# Patient Record
Sex: Male | Born: 1960 | Race: White | Hispanic: No | Smoking: Never smoker
Health system: Southern US, Community
[De-identification: ages and names within clinical notes are randomized; demographics above are authoritative.]

## PROBLEM LIST (undated history)

## (undated) DIAGNOSIS — N183 Chronic kidney disease, stage 3 unspecified: Secondary | ICD-10-CM

## (undated) DIAGNOSIS — M1990 Unspecified osteoarthritis, unspecified site: Secondary | ICD-10-CM

## (undated) DIAGNOSIS — E785 Hyperlipidemia, unspecified: Secondary | ICD-10-CM

## (undated) DIAGNOSIS — G4736 Sleep related hypoventilation in conditions classified elsewhere: Secondary | ICD-10-CM

## (undated) DIAGNOSIS — I10 Essential (primary) hypertension: Secondary | ICD-10-CM

## (undated) DIAGNOSIS — G4730 Sleep apnea, unspecified: Secondary | ICD-10-CM

## (undated) DIAGNOSIS — I872 Venous insufficiency (chronic) (peripheral): Secondary | ICD-10-CM

## (undated) DIAGNOSIS — I82409 Acute embolism and thrombosis of unspecified deep veins of unspecified lower extremity: Secondary | ICD-10-CM

## (undated) DIAGNOSIS — M722 Plantar fascial fibromatosis: Secondary | ICD-10-CM

## (undated) DIAGNOSIS — I809 Phlebitis and thrombophlebitis of unspecified site: Secondary | ICD-10-CM

## (undated) HISTORY — DX: Plantar fascial fibromatosis: M72.2

## (undated) HISTORY — PX: SHOULDER SURGERY: SHX246

## (undated) HISTORY — DX: Unspecified osteoarthritis, unspecified site: M19.90

## (undated) HISTORY — PX: KNEE SURGERY: SHX244

## (undated) HISTORY — DX: Acute embolism and thrombosis of unspecified deep veins of unspecified lower extremity: I82.409

## (undated) HISTORY — DX: Sleep related hypoventilation in conditions classified elsewhere: G47.36

## (undated) HISTORY — DX: Venous insufficiency (chronic) (peripheral): I87.2

## (undated) HISTORY — DX: Essential (primary) hypertension: I10

## (undated) HISTORY — PX: APPENDECTOMY: SHX54

## (undated) HISTORY — DX: Phlebitis and thrombophlebitis of unspecified site: I80.9

## (undated) HISTORY — PX: NOSE SURGERY: SHX723

## (undated) HISTORY — DX: Hyperlipidemia, unspecified: E78.5

---

## 1999-12-09 ENCOUNTER — Encounter: Admission: RE | Admit: 1999-12-09 | Discharge: 1999-12-09 | Payer: Self-pay | Admitting: Orthopedic Surgery

## 1999-12-09 ENCOUNTER — Encounter: Payer: Self-pay | Admitting: Orthopedic Surgery

## 2002-05-31 ENCOUNTER — Encounter: Payer: Self-pay | Admitting: *Deleted

## 2002-05-31 ENCOUNTER — Emergency Department (HOSPITAL_COMMUNITY): Admission: EM | Admit: 2002-05-31 | Discharge: 2002-05-31 | Payer: Self-pay | Admitting: *Deleted

## 2004-03-15 ENCOUNTER — Encounter: Admission: RE | Admit: 2004-03-15 | Discharge: 2004-03-15 | Payer: Self-pay | Admitting: Endocrinology

## 2004-03-18 ENCOUNTER — Encounter: Admission: RE | Admit: 2004-03-18 | Discharge: 2004-03-18 | Payer: Self-pay | Admitting: Endocrinology

## 2004-05-18 ENCOUNTER — Encounter: Admission: RE | Admit: 2004-05-18 | Discharge: 2004-06-17 | Payer: Self-pay | Admitting: Neurosurgery

## 2005-06-01 ENCOUNTER — Ambulatory Visit: Payer: Self-pay | Admitting: Endocrinology

## 2005-06-07 ENCOUNTER — Ambulatory Visit: Payer: Self-pay | Admitting: Endocrinology

## 2005-07-07 ENCOUNTER — Ambulatory Visit: Payer: Self-pay | Admitting: Endocrinology

## 2005-07-15 ENCOUNTER — Ambulatory Visit: Payer: Self-pay | Admitting: Physical Medicine & Rehabilitation

## 2005-07-15 ENCOUNTER — Encounter
Admission: RE | Admit: 2005-07-15 | Discharge: 2005-10-13 | Payer: Self-pay | Admitting: Physical Medicine & Rehabilitation

## 2005-07-27 ENCOUNTER — Ambulatory Visit: Payer: Self-pay | Admitting: Endocrinology

## 2006-06-05 ENCOUNTER — Ambulatory Visit: Payer: Self-pay | Admitting: Endocrinology

## 2006-06-26 ENCOUNTER — Ambulatory Visit: Payer: Self-pay | Admitting: Endocrinology

## 2006-06-28 ENCOUNTER — Ambulatory Visit: Payer: Self-pay | Admitting: Endocrinology

## 2007-05-18 ENCOUNTER — Encounter: Payer: Self-pay | Admitting: Endocrinology

## 2007-05-18 DIAGNOSIS — J309 Allergic rhinitis, unspecified: Secondary | ICD-10-CM | POA: Insufficient documentation

## 2007-05-18 DIAGNOSIS — K219 Gastro-esophageal reflux disease without esophagitis: Secondary | ICD-10-CM

## 2007-05-18 DIAGNOSIS — I10 Essential (primary) hypertension: Secondary | ICD-10-CM

## 2007-05-18 DIAGNOSIS — M109 Gout, unspecified: Secondary | ICD-10-CM

## 2007-05-18 DIAGNOSIS — E119 Type 2 diabetes mellitus without complications: Secondary | ICD-10-CM

## 2007-09-11 ENCOUNTER — Ambulatory Visit: Payer: Self-pay | Admitting: Endocrinology

## 2007-09-11 DIAGNOSIS — K7689 Other specified diseases of liver: Secondary | ICD-10-CM | POA: Insufficient documentation

## 2007-09-11 DIAGNOSIS — M545 Low back pain: Secondary | ICD-10-CM

## 2007-09-11 DIAGNOSIS — M79609 Pain in unspecified limb: Secondary | ICD-10-CM | POA: Insufficient documentation

## 2007-09-14 ENCOUNTER — Encounter: Payer: Self-pay | Admitting: Endocrinology

## 2007-09-14 LAB — CONVERTED CEMR LAB
ALT: 30 units/L (ref 0–53)
Albumin: 3.6 g/dL (ref 3.5–5.2)
Alkaline Phosphatase: 63 units/L (ref 39–117)
BUN: 12 mg/dL (ref 6–23)
CO2: 27 meq/L (ref 19–32)
Calcium: 9.2 mg/dL (ref 8.4–10.5)
Crystals: NEGATIVE
Direct LDL: 115.7 mg/dL
GFR calc Af Amer: 116 mL/min
GFR calc non Af Amer: 96 mL/min
Hgb A1c MFr Bld: 7.1 % — ABNORMAL HIGH (ref 4.6–6.0)
Ketones, ur: NEGATIVE mg/dL
Leukocytes, UA: NEGATIVE
Microalb Creat Ratio: 8.1 mg/g (ref 0.0–30.0)
Microalb, Ur: 0.8 mg/dL (ref 0.0–1.9)
Mucus, UA: NEGATIVE
PSA: 0.24 ng/mL (ref 0.10–4.00)
Potassium: 4.2 meq/L (ref 3.5–5.1)
RBC / HPF: NONE SEEN
Specific Gravity, Urine: 1.025 (ref 1.000–1.03)
Total CHOL/HDL Ratio: 6.2
Total Protein: 7.4 g/dL (ref 6.0–8.3)
Uric Acid, Serum: 6.5 mg/dL (ref 2.4–7.0)

## 2008-02-04 ENCOUNTER — Encounter: Admission: RE | Admit: 2008-02-04 | Discharge: 2008-02-04 | Payer: Self-pay | Admitting: Specialist

## 2008-03-18 ENCOUNTER — Telehealth (INDEPENDENT_AMBULATORY_CARE_PROVIDER_SITE_OTHER): Payer: Self-pay | Admitting: *Deleted

## 2008-07-09 ENCOUNTER — Ambulatory Visit: Payer: Self-pay | Admitting: Endocrinology

## 2008-07-09 LAB — CONVERTED CEMR LAB
ALT: 35 units/L (ref 0–53)
AST: 21 units/L (ref 0–37)
Basophils Absolute: 0 10*3/uL (ref 0.0–0.1)
Basophils Relative: 0.5 % (ref 0.0–3.0)
Bilirubin, Direct: 0.1 mg/dL (ref 0.0–0.3)
CO2: 32 meq/L (ref 19–32)
Calcium: 8.9 mg/dL (ref 8.4–10.5)
Chloride: 104 meq/L (ref 96–112)
Glucose, Bld: 132 mg/dL — ABNORMAL HIGH (ref 70–99)
Hemoglobin: 15.7 g/dL (ref 13.0–17.0)
LDL Cholesterol: 98 mg/dL (ref 0–99)
Leukocytes, UA: NEGATIVE
Lymphocytes Relative: 30.8 % (ref 12.0–46.0)
MCHC: 35.4 g/dL (ref 30.0–36.0)
Monocytes Relative: 7.8 % (ref 3.0–12.0)
Neutro Abs: 3.2 10*3/uL (ref 1.4–7.7)
Neutrophils Relative %: 58.1 % (ref 43.0–77.0)
Nitrite: NEGATIVE
RBC: 4.94 M/uL (ref 4.22–5.81)
RDW: 12.5 % (ref 11.5–14.6)
Sodium: 143 meq/L (ref 135–145)
Specific Gravity, Urine: 1.025 (ref 1.000–1.03)
Total Bilirubin: 0.7 mg/dL (ref 0.3–1.2)
Total CHOL/HDL Ratio: 5.6
Total Protein: 7.2 g/dL (ref 6.0–8.3)
Urobilinogen, UA: 0.2 (ref 0.0–1.0)
pH: 5.5 (ref 5.0–8.0)

## 2008-07-11 ENCOUNTER — Ambulatory Visit: Payer: Self-pay | Admitting: Endocrinology

## 2008-07-11 DIAGNOSIS — D696 Thrombocytopenia, unspecified: Secondary | ICD-10-CM

## 2008-07-11 DIAGNOSIS — E291 Testicular hypofunction: Secondary | ICD-10-CM | POA: Insufficient documentation

## 2008-07-11 LAB — CONVERTED CEMR LAB: Estradiol: 27.4 pg/mL

## 2008-07-12 LAB — CONVERTED CEMR LAB
FSH: 8.1 milliintl units/mL
LH: 10.1 milliintl units/mL
Testosterone: 149.92 ng/dL — ABNORMAL LOW (ref 350.00–890)

## 2008-07-30 ENCOUNTER — Ambulatory Visit: Payer: Self-pay | Admitting: Gastroenterology

## 2008-09-01 ENCOUNTER — Ambulatory Visit: Payer: Self-pay | Admitting: Gastroenterology

## 2008-09-01 ENCOUNTER — Telehealth: Payer: Self-pay | Admitting: Gastroenterology

## 2008-09-03 ENCOUNTER — Ambulatory Visit: Payer: Self-pay | Admitting: Gastroenterology

## 2008-09-03 ENCOUNTER — Encounter: Payer: Self-pay | Admitting: Gastroenterology

## 2008-09-03 ENCOUNTER — Ambulatory Visit (HOSPITAL_COMMUNITY): Admission: RE | Admit: 2008-09-03 | Discharge: 2008-09-03 | Payer: Self-pay | Admitting: Gastroenterology

## 2008-09-05 ENCOUNTER — Encounter: Payer: Self-pay | Admitting: Gastroenterology

## 2009-07-08 ENCOUNTER — Encounter: Payer: Self-pay | Admitting: Endocrinology

## 2009-07-13 ENCOUNTER — Telehealth: Payer: Self-pay | Admitting: Endocrinology

## 2009-07-13 ENCOUNTER — Ambulatory Visit: Payer: Self-pay | Admitting: Endocrinology

## 2009-07-14 ENCOUNTER — Telehealth: Payer: Self-pay | Admitting: Endocrinology

## 2009-07-14 ENCOUNTER — Telehealth: Payer: Self-pay | Admitting: Family Medicine

## 2009-07-15 LAB — CONVERTED CEMR LAB
ALT: 44 units/L (ref 0–53)
AST: 27 units/L (ref 0–37)
Albumin: 3.5 g/dL (ref 3.5–5.2)
Alkaline Phosphatase: 50 units/L (ref 39–117)
BUN: 10 mg/dL (ref 6–23)
Basophils Absolute: 0.1 10*3/uL (ref 0.0–0.1)
Basophils Relative: 0.9 % (ref 0.0–3.0)
Bilirubin, Direct: 0.1 mg/dL (ref 0.0–0.3)
CO2: 28 meq/L (ref 19–32)
Calcium: 8.7 mg/dL (ref 8.4–10.5)
Chloride: 102 meq/L (ref 96–112)
Cholesterol: 167 mg/dL (ref 0–200)
Creatinine, Ser: 0.8 mg/dL (ref 0.4–1.5)
Eosinophils Absolute: 0.3 10*3/uL (ref 0.0–0.7)
Eosinophils Relative: 4.9 % (ref 0.0–5.0)
GFR calc non Af Amer: 108.58 mL/min (ref >60)
Glucose, Bld: 295 mg/dL — ABNORMAL HIGH (ref 70–99)
HCT: 45.1 % (ref 39.0–52.0)
HDL: 23.6 mg/dL — ABNORMAL LOW (ref >39.00)
Hemoglobin: 15.6 g/dL (ref 13.0–17.0)
LDL Cholesterol: 109 mg/dL — ABNORMAL HIGH (ref 0–99)
Lymphocytes Relative: 28.1 % (ref 12.0–46.0)
Lymphs Abs: 1.6 10*3/uL (ref 0.7–4.0)
MCHC: 34.6 g/dL (ref 30.0–36.0)
MCV: 91 fL (ref 78.0–100.0)
Monocytes Absolute: 0.4 10*3/uL (ref 0.1–1.0)
Monocytes Relative: 6.9 % (ref 3.0–12.0)
Neutro Abs: 3.4 10*3/uL (ref 1.4–7.7)
Neutrophils Relative %: 59.2 % (ref 43.0–77.0)
Platelets: 127 10*3/uL — ABNORMAL LOW (ref 150.0–400.0)
Potassium: 4.1 meq/L (ref 3.5–5.1)
RBC: 4.95 M/uL (ref 4.22–5.81)
RDW: 12.3 % (ref 11.5–14.6)
Sodium: 139 meq/L (ref 135–145)
TSH: 1.42 microintl units/mL (ref 0.35–5.50)
Total Bilirubin: 0.7 mg/dL (ref 0.3–1.2)
Total CHOL/HDL Ratio: 7
Total Protein: 7.1 g/dL (ref 6.0–8.3)
Triglycerides: 171 mg/dL — ABNORMAL HIGH (ref 0.0–149.0)
Uric Acid, Serum: 7.2 mg/dL (ref 4.0–7.8)
VLDL: 34.2 mg/dL (ref 0.0–40.0)
WBC: 5.8 10*3/uL (ref 4.5–10.5)

## 2009-08-10 ENCOUNTER — Telehealth: Payer: Self-pay | Admitting: Endocrinology

## 2010-10-08 ENCOUNTER — Encounter
Admission: RE | Admit: 2010-10-08 | Discharge: 2010-10-08 | Payer: Self-pay | Source: Home / Self Care | Attending: Orthopaedic Surgery | Admitting: Orthopaedic Surgery

## 2010-11-02 ENCOUNTER — Other Ambulatory Visit: Payer: Self-pay | Admitting: Orthopaedic Surgery

## 2010-11-02 ENCOUNTER — Encounter (HOSPITAL_COMMUNITY): Payer: PRIVATE HEALTH INSURANCE | Attending: Orthopaedic Surgery

## 2010-11-02 ENCOUNTER — Other Ambulatory Visit (HOSPITAL_COMMUNITY): Payer: PRIVATE HEALTH INSURANCE

## 2010-11-02 DIAGNOSIS — Z01812 Encounter for preprocedural laboratory examination: Secondary | ICD-10-CM | POA: Insufficient documentation

## 2010-11-02 LAB — BASIC METABOLIC PANEL
Calcium: 9.5 mg/dL (ref 8.4–10.5)
GFR calc Af Amer: >60 mL/min (ref >60)
GFR calc non Af Amer: >60 mL/min (ref >60)
Sodium: 138 mEq/L (ref 135–145)

## 2010-11-02 LAB — CBC
Hemoglobin: 15.2 g/dL (ref 13.0–17.0)
MCHC: 35.8 g/dL (ref 30.0–36.0)
RDW: 12.6 % (ref 11.5–15.5)
WBC: 6.3 10*3/uL (ref 4.0–10.5)

## 2010-11-02 LAB — SURGICAL PCR SCREEN: MRSA, PCR: NEGATIVE

## 2010-11-05 ENCOUNTER — Observation Stay (HOSPITAL_COMMUNITY)
Admission: RE | Admit: 2010-11-05 | Discharge: 2010-11-06 | Disposition: A | Discharge status: Home / Self Care | Payer: PRIVATE HEALTH INSURANCE | Source: Ambulatory Visit | Attending: Orthopaedic Surgery | Admitting: Orthopaedic Surgery

## 2010-11-05 DIAGNOSIS — M23329 Other meniscus derangements, posterior horn of medial meniscus, unspecified knee: Secondary | ICD-10-CM | POA: Insufficient documentation

## 2010-11-05 DIAGNOSIS — M79609 Pain in unspecified limb: Secondary | ICD-10-CM | POA: Insufficient documentation

## 2010-11-05 DIAGNOSIS — Z794 Long term (current) use of insulin: Secondary | ICD-10-CM | POA: Insufficient documentation

## 2010-11-05 DIAGNOSIS — M2240 Chondromalacia patellae, unspecified knee: Secondary | ICD-10-CM | POA: Insufficient documentation

## 2010-11-05 DIAGNOSIS — K219 Gastro-esophageal reflux disease without esophagitis: Secondary | ICD-10-CM | POA: Insufficient documentation

## 2010-11-05 DIAGNOSIS — E669 Obesity, unspecified: Secondary | ICD-10-CM | POA: Insufficient documentation

## 2010-11-05 DIAGNOSIS — I10 Essential (primary) hypertension: Secondary | ICD-10-CM | POA: Insufficient documentation

## 2010-11-05 DIAGNOSIS — E119 Type 2 diabetes mellitus without complications: Secondary | ICD-10-CM | POA: Insufficient documentation

## 2010-11-05 DIAGNOSIS — M722 Plantar fascial fibromatosis: Principal | ICD-10-CM | POA: Insufficient documentation

## 2010-11-05 LAB — GLUCOSE, CAPILLARY
Glucose-Capillary: 135 mg/dL — ABNORMAL HIGH (ref 70–99)
Glucose-Capillary: 186 mg/dL — ABNORMAL HIGH (ref 70–99)
Glucose-Capillary: 209 mg/dL — ABNORMAL HIGH (ref 70–99)

## 2010-11-10 ENCOUNTER — Other Ambulatory Visit: Payer: Self-pay | Admitting: Orthopaedic Surgery

## 2010-11-10 ENCOUNTER — Ambulatory Visit
Admission: RE | Admit: 2010-11-10 | Discharge: 2010-11-10 | Disposition: A | Discharge status: Home / Self Care | Payer: PRIVATE HEALTH INSURANCE | Source: Ambulatory Visit | Attending: Orthopaedic Surgery | Admitting: Orthopaedic Surgery

## 2010-11-10 ENCOUNTER — Inpatient Hospital Stay (HOSPITAL_COMMUNITY)
Admission: EM | Admit: 2010-11-10 | Discharge: 2010-11-19 | DRG: 300 | Disposition: A | Discharge status: Home / Self Care | Payer: PRIVATE HEALTH INSURANCE | Attending: Orthopaedic Surgery | Admitting: Orthopaedic Surgery

## 2010-11-10 DIAGNOSIS — E119 Type 2 diabetes mellitus without complications: Secondary | ICD-10-CM | POA: Diagnosis present

## 2010-11-10 DIAGNOSIS — R609 Edema, unspecified: Secondary | ICD-10-CM

## 2010-11-10 DIAGNOSIS — R1031 Right lower quadrant pain: Secondary | ICD-10-CM

## 2010-11-10 DIAGNOSIS — Z794 Long term (current) use of insulin: Secondary | ICD-10-CM

## 2010-11-10 DIAGNOSIS — Y838 Other surgical procedures as the cause of abnormal reaction of the patient, or of later complication, without mention of misadventure at the time of the procedure: Secondary | ICD-10-CM | POA: Diagnosis present

## 2010-11-10 DIAGNOSIS — I824Y9 Acute embolism and thrombosis of unspecified deep veins of unspecified proximal lower extremity: Secondary | ICD-10-CM | POA: Diagnosis present

## 2010-11-10 DIAGNOSIS — I999 Unspecified disorder of circulatory system: Principal | ICD-10-CM | POA: Diagnosis present

## 2010-11-10 DIAGNOSIS — I10 Essential (primary) hypertension: Secondary | ICD-10-CM | POA: Diagnosis present

## 2010-11-10 LAB — CBC
MCH: 30.5 pg (ref 26.0–34.0)
MCHC: 33.5 g/dL (ref 30.0–36.0)
Platelets: 123 10*3/uL — ABNORMAL LOW (ref 150–400)

## 2010-11-10 LAB — PROTIME-INR: Prothrombin Time: 13.9 seconds (ref 11.6–15.2)

## 2010-11-11 LAB — CBC
MCH: 30.1 pg (ref 26.0–34.0)
MCV: 91.5 fL (ref 78.0–100.0)
Platelets: 116 10*3/uL — ABNORMAL LOW (ref 150–400)
RBC: 4.35 MIL/uL (ref 4.22–5.81)
RDW: 12.7 % (ref 11.5–15.5)

## 2010-11-11 LAB — GLUCOSE, CAPILLARY: Glucose-Capillary: 276 mg/dL — ABNORMAL HIGH (ref 70–99)

## 2010-11-12 LAB — BASIC METABOLIC PANEL
CO2: 27 mEq/L (ref 19–32)
Chloride: 103 mEq/L (ref 96–112)
GFR calc Af Amer: >60 mL/min (ref >60)
Potassium: 4.3 mEq/L (ref 3.5–5.1)
Sodium: 138 mEq/L (ref 135–145)

## 2010-11-12 LAB — GLUCOSE, CAPILLARY
Glucose-Capillary: 161 mg/dL — ABNORMAL HIGH (ref 70–99)
Glucose-Capillary: 200 mg/dL — ABNORMAL HIGH (ref 70–99)
Glucose-Capillary: 198 mg/dL — ABNORMAL HIGH (ref 70–99)

## 2010-11-12 LAB — PROTIME-INR: INR: 1.03 (ref 0.00–1.49)

## 2010-11-12 NOTE — Op Note (Signed)
NAMEJEDADIAH, ABDALLAH NO.:  [id number]  MEDICAL RECORD NO.:  [id number]           PATIENT TYPE:  I  LOCATION:  1608                         FACILITY:  [facility]  PHYSICIAN:  Vanita Panda. Magnus Ivan, M.D.DATE OF BIRTH:  07-15-1961  DATE OF PROCEDURE:  11/05/2010 DATE OF DISCHARGE:                              OPERATIVE REPORT   PREOPERATIVE DIAGNOSES: 1. Left heel pain and plantar fascitis. 2. 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  POSTOPERATIVE DIAGNOSES: 1. Left heel pain and plantar fascitis. 2. 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  PROCEDURES: 1. Steroid injection with 40 of Depo-Medrol mixed with 4 cc of 1%     lidocaine plain in to the left heel. 2. Right knee arthroscopy with debridement, partial medial  meniscectomy, and chondroplasty of patellofemoral joint.  SURGEON:  Vanita Panda. Magnus Ivan, MD  ASSISTANT:  Flo Shanks, MS4  ESTIMATED BLOOD LOSS:  Minimal.  COMPLICATIONS:  None.  INDICATION:  Mr. Alkhatib is a 50 year old gentleman who weighs in excess of about 360 pounds.  He developed right knee pain with locking and catching that was acute in onset.  An MRI was obtained that showed a complex medial meniscal tear.  He had been having problems getting down on his knees and it did show significant chondromalacia of the patellofemoral joint, but otherwise cartilage well maintained.  He has been having some intractable left heel pain consistent with plantar fascitis as well.  We recommended right knee arthroscopy and he felt like as if he was in the operating room for surgery and being put to sleep, so that I could provide a sterile injection in his left heel.  I counseled him about the risk and benefits of this in general.  He is a diabetic as well.  He did wish to  proceed with surgery.  DESCRIPTION OF PROCEDURE:  After informed consent was obtained, appropriate left heel and right knee were marked.  He was brought to the operating room and placed supine on the operating table.  General anesthesia was then obtained.  At first, we proceeded with a time-out for left heel.  I did prep the heel with Betadine and alcohol and a time- out was called to identify the correct patient and correct left heel. We then provided an injection of 4 cc of 1% plain lidocaine mixed with 1 cc of 40 of Depo-Medrol into the heel and I placed a Band-Aid over this. We then prepped his right knee from the thigh down to the ankle with DuraPrep and sterile drapes including sterile stockinette.  A lateral leg approach was utilized and the bed was raised and the knee was flexed off the side of the table.  A time-out was called again and he was identified as the correct patient and the correct right knee.  I then made an anterior arthroscopy portal off the anterolateral aspect of the knee in the soft part of the knee  and started a cannula.  There was no significant effusion encountered or kink  encountered.  I then placed the camera into the knee, went to medial compartment, where I made an anterior and medial arthroscopy portal.  Right away, we could see that there was a significant medial meniscal tear from the midbody centrally back to the posterior horn. Using arthroscopic shaver and up-cutting biters, we were able to clean the meniscus up and get it trimmed back to at least the red-white zone and left meniscal tissue.  He had probably grade 2 at least chondromalacia of the medial femoral condyle, but nothing extensive.  His intercondylar area of the knee showed an intact ACL and PCL with the knee in a figure-of-4 position.  The lateral compartment was also intact with some mild chondromalacia of the tibial plateau.  Finally, with the knee in extended position, we assessed  the patellofemoral joint.  There was full thickness cartilage loss on the lateral aspect of the patellofemoral joint at the femur and the patella undersurface also showed complete loss of cartilage.  We performed chondroplasty in the patellofemoral joint.  I then removed all instrumentation, allowed fluid to drain from the knee.  I closed the arthroscopy portal sites with interrupted nylon suture.  We inserted a mixture of morphine and Marcaine into the knee, Xeroform followed by well-padded sterile dressings were applied, and the patient was awakened, extubated, and taken to the recovery room in stable condition. Postoperatively, we will admit him for 23-hour observation with slowly increasing his activity as well.     Vanita Panda. Magnus Ivan, M.D.     CYB/MEDQ  D:  11/05/2010  T:  11/05/2010  Job:  [id number]  Electronically Signed by Doneen Poisson M.D. on 11/12/2010 08:15:27 PM

## 2010-11-12 NOTE — H&P (Signed)
  NAMEKAILAN, [name] NO.:  [id number]  MEDICAL RECORD NO.:  [id number]           PATIENT TYPE:  I  LOCATION:  1608                         FACILITY:  [facility]  PHYSICIAN:  Vanita Panda. Magnus Ivan, M.D.DATE OF BIRTH:  06/11/1961  DATE OF ADMISSION:  11/05/2010 DATE OF DISCHARGE:                             HISTORY & PHYSICAL   CHIEF COMPLAINT: 1. Right knee pain. 2. Left heel pain.  HISTORY OF PRESENT ILLNESS:  [name] is a 50 year old gentleman who has had worsening right knee pain and left heel.  The left heel, I feel, has been plantar fasciitis.  The right knee had an MRI that showed a complex tear of the midline posterior horn of the medial meniscus as well as chondromalacia of the patella.  Due to the effects on his activities of daily living with locking and catching and worsening pain, we decided to proceed with a right knee arthroscopy and while he is in the operating room we will provide a steroid injection in his left heel.  PAST MEDICAL HISTORY:  Diabetes.  CURRENT MEDICATIONS:  Percocet, Mobic, glyburide, fish oil, Humalog.  ALLERGIES:  NO KNOWN DRUG ALLERGIES.  FAMILY MEDICAL HISTORY:  Diabetes and cancer.  SOCIAL HISTORY:  He is married.  He is self-employed.  He does not smoke and does not drink.  REVIEW OF SYSTEMS:  Negative for chest pain, shortness of breath, fever, chills, nausea or vomiting.  PHYSICAL EXAMINATION:  VITAL SIGNS:  He is afebrile with stable vital signs. GENERAL:  He is alert and oriented x3 in no acute distress or obvious discomfort. HEENT:  Normocephalic, atraumatic.  Pupils are equal, round and reactive to light.  Extraocular muscles are intact. NECK:  Supple. LUNGS:  Clear to auscultation bilaterally. HEART:  Regular rate and rhythm. ABDOMEN:  Obese, but benign. EXTREMITIES:  Right knee shows an effusion with painful medial joint line and a positive McMurray's sign.  Left heel shows pain on the bottom of the  heel.  ASSESSMENT: 1. Right knee medial meniscal tear and chondromalacia of the patella. 2. Left foot plantar fasciitis.  PLAN:  We will proceed with a right knee arthroscopy today as well as a steroid injection in his left heel.  We will then admit him for observation just due to the surgery and his obesity as well as watching his blood glucose levels after the steroid injection.     Vanita Panda. Magnus Ivan, M.D.     CYB/MEDQ  D:  11/05/2010  T:  11/05/2010  Job:  [id number]  Electronically Signed by Doneen Poisson M.D. on 11/12/2010 08:15:24 PM

## 2010-11-13 LAB — GLUCOSE, CAPILLARY: Glucose-Capillary: 247 mg/dL — ABNORMAL HIGH (ref 70–99)

## 2010-11-14 LAB — CBC
HCT: 40.6 % (ref 39.0–52.0)
Hemoglobin: 13.3 g/dL (ref 13.0–17.0)
MCH: 29.8 pg (ref 26.0–34.0)
RBC: 4.46 MIL/uL (ref 4.22–5.81)

## 2010-11-14 LAB — DIFFERENTIAL
Basophils Relative: 1 % (ref 0–1)
Lymphocytes Relative: 33 % (ref 12–46)
Monocytes Relative: 7 % (ref 3–12)
Neutro Abs: 3.1 10*3/uL (ref 1.7–7.7)
Neutrophils Relative %: 55 % (ref 43–77)

## 2010-11-14 LAB — GLUCOSE, CAPILLARY
Glucose-Capillary: 224 mg/dL — ABNORMAL HIGH (ref 70–99)
Glucose-Capillary: 362 mg/dL — ABNORMAL HIGH (ref 70–99)

## 2010-11-14 LAB — PROTIME-INR
INR: 1.19 (ref 0.00–1.49)
Prothrombin Time: 15.3 seconds — ABNORMAL HIGH (ref 11.6–15.2)

## 2010-11-15 LAB — GLUCOSE, CAPILLARY
Glucose-Capillary: 186 mg/dL — ABNORMAL HIGH (ref 70–99)
Glucose-Capillary: 230 mg/dL — ABNORMAL HIGH (ref 70–99)
Glucose-Capillary: 261 mg/dL — ABNORMAL HIGH (ref 70–99)

## 2010-11-15 LAB — PROTIME-INR: INR: 1.51 — ABNORMAL HIGH (ref 0.00–1.49)

## 2010-11-16 LAB — GLUCOSE, CAPILLARY: Glucose-Capillary: 291 mg/dL — ABNORMAL HIGH (ref 70–99)

## 2010-11-17 LAB — GLUCOSE, CAPILLARY
Glucose-Capillary: 186 mg/dL — ABNORMAL HIGH (ref 70–99)
Glucose-Capillary: 235 mg/dL — ABNORMAL HIGH (ref 70–99)

## 2010-11-17 LAB — PROTIME-INR: Prothrombin Time: 19.2 seconds — ABNORMAL HIGH (ref 11.6–15.2)

## 2010-11-17 LAB — CBC
Platelets: 151 10*3/uL (ref 150–400)
RBC: 4.62 MIL/uL (ref 4.22–5.81)
RDW: 12.5 % (ref 11.5–15.5)
WBC: 6.3 10*3/uL (ref 4.0–10.5)

## 2010-11-18 LAB — GLUCOSE, CAPILLARY
Glucose-Capillary: 207 mg/dL — ABNORMAL HIGH (ref 70–99)
Glucose-Capillary: 118 mg/dL — ABNORMAL HIGH (ref 70–99)
Glucose-Capillary: 192 mg/dL — ABNORMAL HIGH (ref 70–99)

## 2010-11-18 LAB — PROTIME-INR: INR: 1.89 — ABNORMAL HIGH (ref 0.00–1.49)

## 2010-11-19 LAB — PROTIME-INR: INR: 2.49 — ABNORMAL HIGH (ref 0.00–1.49)

## 2010-11-20 NOTE — Discharge Summary (Signed)
NAMERADFORD, PEASE NO.:  [id number]  MEDICAL RECORD NO.:  [id number]           PATIENT TYPE:  I  LOCATION:  1618                         FACILITY:  [facility]  PHYSICIAN:  Vanita Panda. Magnus Ivan, M.D.DATE OF BIRTH:  12/17/1960  DATE OF ADMISSION:  11/10/2010 DATE OF DISCHARGE:  11/19/2010                              DISCHARGE SUMMARY   ADMITTING DIAGNOSIS:  Right lower extremity deep vein thrombosis (DVT) status post knee arthroscopy.  DISCHARGE DIAGNOSIS:  Right lower extremity deep vein thrombosis (DVT) status post knee arthroscopy.  SECONDARY DIAGNOSES: 1. High blood pressure. 2. Diabetes. 3. Obesity.  PROCEDURES:  None.  HOSPITAL COURSE:  [name] is a 50 year old gentleman who 5 days prior to admission underwent a right knee arthroscopy for a torn meniscus.  I saw him in the office about 5 days postoperative, and he had a very tight swollen calf.  I sent him for a noninvasive Doppler study, it was positive for DVT in the popliteal fossa.  This was a femoral vein DVT. He was admitted as an inpatient for anticoagulation.  He was started on Lovenox at therapeutic dose as well as Coumadin.  His prolonged hospitalization was only secondary to getting him therapeutic on his Coumadin.  It was felt we needed to keep him in the hospital due to his obesity and the fact this was DVT that was quite large.  There was definitely anxiety associated with having to have Coumadin as well and overall expensive Coumadin played a factor.  The most importantly though, due to the risk of pulmonary embolus, we wanted to make sure that he was therapeutic on Coumadin before he was discharged.  Dr. [name], his primary care physician did visit him while he was in the hospital as well to offer recommendations about medical care.  HOSPITAL COURSE:  His hospital course was uneventful.  He never did develop any shortness of breath and the swelling in his calf did subside.  By  the day of discharge, he was therapeutic with an INR of 2.49 but this took up to 20 mg of Coumadin to get him to this level.  By the day of discharge, he was afebrile, stable vital signs, and again, complains of no dizziness, lightheadedness, shortness of breath, or chest pain.  It was felt he could be discharged safely to home.  DISPOSITION:  Home.  DISCHARGE INSTRUCTIONS:  While he is at home, he will continue his Coumadin daily at 6:00 p.m.  He will call Dr. [name] office at [facility] to be seen in the Coumadin clinic on Monday, November 22, 2010.  He will take 10 mg of Coumadin at 6 p.m. daily until that time.  He will follow up at [facility] in 1 week after discharge.  DISCHARGE MEDICATIONS: 1. Coumadin 10 mg p.o. daily at 6 p.m. 2. Percocet as needed for pain. 3. Continue all home medications as before as listed on his medication     reconciliation list.     Vanita Panda. Magnus Ivan, M.D.     CYB/MEDQ  D:  11/19/2010  T:  11/19/2010  Job:  [id number]  cc:   [name], M.D. Fax: [phone]  Electronically Signed by Doneen Poisson M.D. on 11/20/2010 05:02:17 PM

## 2010-11-22 ENCOUNTER — Inpatient Hospital Stay (HOSPITAL_COMMUNITY)
Admission: AD | Admit: 2010-11-22 | Discharge: 2010-12-07 | DRG: 300 | Disposition: A | Discharge status: Home / Self Care | Payer: PRIVATE HEALTH INSURANCE | Source: Ambulatory Visit | Attending: Internal Medicine | Admitting: Internal Medicine

## 2010-11-22 DIAGNOSIS — Y849 Medical procedure, unspecified as the cause of abnormal reaction of the patient, or of later complication, without mention of misadventure at the time of the procedure: Secondary | ICD-10-CM | POA: Diagnosis present

## 2010-11-22 DIAGNOSIS — E119 Type 2 diabetes mellitus without complications: Secondary | ICD-10-CM | POA: Diagnosis present

## 2010-11-22 DIAGNOSIS — M1710 Unilateral primary osteoarthritis, unspecified knee: Secondary | ICD-10-CM | POA: Diagnosis present

## 2010-11-22 DIAGNOSIS — I82890 Acute embolism and thrombosis of other specified veins: Secondary | ICD-10-CM | POA: Diagnosis present

## 2010-11-22 DIAGNOSIS — I82409 Acute embolism and thrombosis of unspecified deep veins of unspecified lower extremity: Secondary | ICD-10-CM

## 2010-11-22 DIAGNOSIS — E785 Hyperlipidemia, unspecified: Secondary | ICD-10-CM | POA: Diagnosis present

## 2010-11-22 DIAGNOSIS — I998 Other disorder of circulatory system: Principal | ICD-10-CM | POA: Diagnosis present

## 2010-11-22 LAB — CBC
HCT: 40.9 % (ref 39.0–52.0)
MCH: 30.2 pg (ref 26.0–34.0)
MCV: 90.9 fL (ref 78.0–100.0)
RBC: 4.5 MIL/uL (ref 4.22–5.81)
WBC: 7.3 10*3/uL (ref 4.0–10.5)

## 2010-11-22 LAB — DIFFERENTIAL
Eosinophils Absolute: 0.2 10*3/uL (ref 0.0–0.7)
Lymphocytes Relative: 27 % (ref 12–46)
Lymphs Abs: 2 10*3/uL (ref 0.7–4.0)
Monocytes Relative: 7 % (ref 3–12)
Neutrophils Relative %: 62 % (ref 43–77)

## 2010-11-22 LAB — GLUCOSE, CAPILLARY: Glucose-Capillary: 162 mg/dL — ABNORMAL HIGH (ref 70–99)

## 2010-11-22 LAB — COMPREHENSIVE METABOLIC PANEL
ALT: 30 U/L (ref 0–53)
Alkaline Phosphatase: 54 U/L (ref 39–117)
BUN: 16 mg/dL (ref 6–23)
CO2: 29 mEq/L (ref 19–32)
Chloride: 101 mEq/L (ref 96–112)
GFR calc non Af Amer: >60 mL/min (ref >60)
Glucose, Bld: 278 mg/dL — ABNORMAL HIGH (ref 70–99)
Potassium: 4.4 mEq/L (ref 3.5–5.1)
Sodium: 139 mEq/L (ref 135–145)
Total Bilirubin: 0.6 mg/dL (ref 0.3–1.2)

## 2010-11-22 LAB — PROTIME-INR
INR: 1.2 (ref 0.00–1.49)
Prothrombin Time: 15.4 seconds — ABNORMAL HIGH (ref 11.6–15.2)

## 2010-11-23 ENCOUNTER — Inpatient Hospital Stay (HOSPITAL_COMMUNITY): Payer: PRIVATE HEALTH INSURANCE

## 2010-11-23 LAB — COMPREHENSIVE METABOLIC PANEL
ALT: 31 U/L (ref 0–53)
Albumin: 3.3 g/dL — ABNORMAL LOW (ref 3.5–5.2)
Alkaline Phosphatase: 53 U/L (ref 39–117)
BUN: 16 mg/dL (ref 6–23)
Chloride: 102 mEq/L (ref 96–112)
Glucose, Bld: 160 mg/dL — ABNORMAL HIGH (ref 70–99)
Potassium: 4.2 mEq/L (ref 3.5–5.1)
Sodium: 139 mEq/L (ref 135–145)
Total Bilirubin: 0.4 mg/dL (ref 0.3–1.2)
Total Protein: 7 g/dL (ref 6.0–8.3)

## 2010-11-23 LAB — PROTIME-INR
INR: 1.17 (ref 0.00–1.49)
Prothrombin Time: 15.1 seconds (ref 11.6–15.2)

## 2010-11-23 LAB — GLUCOSE, CAPILLARY: Glucose-Capillary: 105 mg/dL — ABNORMAL HIGH (ref 70–99)

## 2010-11-23 LAB — DIFFERENTIAL
Basophils Absolute: 0 10*3/uL (ref 0.0–0.1)
Eosinophils Relative: 5 % (ref 0–5)
Lymphocytes Relative: 33 % (ref 12–46)
Lymphs Abs: 2 10*3/uL (ref 0.7–4.0)
Neutro Abs: 3.2 10*3/uL (ref 1.7–7.7)
Neutrophils Relative %: 53 % (ref 43–77)

## 2010-11-23 LAB — CBC
HCT: 42.4 % (ref 39.0–52.0)
MCV: 91.8 fL (ref 78.0–100.0)
RBC: 4.62 MIL/uL (ref 4.22–5.81)
RDW: 13 % (ref 11.5–15.5)
WBC: 6.1 10*3/uL (ref 4.0–10.5)

## 2010-11-23 MED ORDER — IOHEXOL 300 MG/ML  SOLN
100.0000 mL | Freq: Once | INTRAMUSCULAR | Status: AC | PRN
  Administered 2010-11-23: 50 mL via INTRAVENOUS

## 2010-11-24 ENCOUNTER — Other Ambulatory Visit (HOSPITAL_COMMUNITY): Payer: PRIVATE HEALTH INSURANCE

## 2010-11-24 ENCOUNTER — Inpatient Hospital Stay (HOSPITAL_COMMUNITY): Payer: PRIVATE HEALTH INSURANCE

## 2010-11-24 LAB — CBC
Platelets: 173 10*3/uL (ref 150–400)
RBC: 4.6 MIL/uL (ref 4.22–5.81)
RDW: 13 % (ref 11.5–15.5)
WBC: 7.6 10*3/uL (ref 4.0–10.5)

## 2010-11-24 LAB — DIFFERENTIAL
Basophils Absolute: 0 10*3/uL (ref 0.0–0.1)
Eosinophils Absolute: 0.3 10*3/uL (ref 0.0–0.7)
Eosinophils Relative: 4 % (ref 0–5)
Neutrophils Relative %: 61 % (ref 43–77)

## 2010-11-24 LAB — BASIC METABOLIC PANEL
Chloride: 103 mEq/L (ref 96–112)
GFR calc Af Amer: >60 mL/min (ref >60)
GFR calc non Af Amer: >60 mL/min (ref >60)
Potassium: 4.5 mEq/L (ref 3.5–5.1)
Sodium: 138 mEq/L (ref 135–145)

## 2010-11-24 LAB — GLUCOSE, CAPILLARY
Glucose-Capillary: 117 mg/dL — ABNORMAL HIGH (ref 70–99)
Glucose-Capillary: 139 mg/dL — ABNORMAL HIGH (ref 70–99)
Glucose-Capillary: 99 mg/dL (ref 70–99)

## 2010-11-24 LAB — FIBRINOGEN: Fibrinogen: 254 mg/dL (ref 204–475)

## 2010-11-24 LAB — HEPARIN LEVEL (UNFRACTIONATED)
Heparin Unfractionated: 0.27 IU/mL — ABNORMAL LOW (ref 0.30–0.70)
Heparin Unfractionated: 0.35 IU/mL (ref 0.30–0.70)

## 2010-11-24 LAB — PROTIME-INR
INR: 1.38 (ref 0.00–1.49)
Prothrombin Time: 17.2 seconds — ABNORMAL HIGH (ref 11.6–15.2)

## 2010-11-24 MED ORDER — IOHEXOL 300 MG/ML  SOLN
100.0000 mL | Freq: Once | INTRAMUSCULAR | Status: AC | PRN
  Administered 2010-11-24: 60 mL via INTRAVENOUS

## 2010-11-25 LAB — DIFFERENTIAL
Basophils Relative: 1 % (ref 0–1)
Eosinophils Absolute: 0.3 10*3/uL (ref 0.0–0.7)
Monocytes Relative: 9 % (ref 3–12)
Neutro Abs: 2.7 10*3/uL (ref 1.7–7.7)
Neutrophils Relative %: 51 % (ref 43–77)

## 2010-11-25 LAB — PROTIME-INR
INR: 1.38 (ref 0.00–1.49)
Prothrombin Time: 17.2 seconds — ABNORMAL HIGH (ref 11.6–15.2)

## 2010-11-25 LAB — BASIC METABOLIC PANEL
Calcium: 8.5 mg/dL (ref 8.4–10.5)
Creatinine, Ser: 0.96 mg/dL (ref 0.4–1.5)
GFR calc Af Amer: >60 mL/min (ref >60)
GFR calc non Af Amer: >60 mL/min (ref >60)
Sodium: 139 mEq/L (ref 135–145)

## 2010-11-25 LAB — CBC
Hemoglobin: 12.4 g/dL — ABNORMAL LOW (ref 13.0–17.0)
MCH: 29.7 pg (ref 26.0–34.0)
Platelets: 139 10*3/uL — ABNORMAL LOW (ref 150–400)
RBC: 4.18 MIL/uL — ABNORMAL LOW (ref 4.22–5.81)
WBC: 5.3 10*3/uL (ref 4.0–10.5)

## 2010-11-25 LAB — GLUCOSE, CAPILLARY
Glucose-Capillary: 102 mg/dL — ABNORMAL HIGH (ref 70–99)
Glucose-Capillary: 154 mg/dL — ABNORMAL HIGH (ref 70–99)

## 2010-11-25 LAB — HEPARIN LEVEL (UNFRACTIONATED): Heparin Unfractionated: 0.22 IU/mL — ABNORMAL LOW (ref 0.30–0.70)

## 2010-11-26 LAB — GLUCOSE, CAPILLARY
Glucose-Capillary: 164 mg/dL — ABNORMAL HIGH (ref 70–99)
Glucose-Capillary: 171 mg/dL — ABNORMAL HIGH (ref 70–99)

## 2010-11-26 LAB — DIFFERENTIAL
Eosinophils Absolute: 0.3 10*3/uL (ref 0.0–0.7)
Lymphocytes Relative: 38 % (ref 12–46)
Lymphs Abs: 2 10*3/uL (ref 0.7–4.0)
Monocytes Relative: 9 % (ref 3–12)
Neutro Abs: 2.5 10*3/uL (ref 1.7–7.7)
Neutrophils Relative %: 47 % (ref 43–77)

## 2010-11-26 LAB — PROTIME-INR
INR: 1.24 (ref 0.00–1.49)
Prothrombin Time: 15.8 seconds — ABNORMAL HIGH (ref 11.6–15.2)

## 2010-11-26 LAB — BASIC METABOLIC PANEL
BUN: 15 mg/dL (ref 6–23)
CO2: 28 mEq/L (ref 19–32)
Chloride: 104 mEq/L (ref 96–112)
Potassium: 4.1 mEq/L (ref 3.5–5.1)

## 2010-11-26 LAB — CBC
Hemoglobin: 13.9 g/dL (ref 13.0–17.0)
MCV: 90.2 fL (ref 78.0–100.0)
Platelets: 159 10*3/uL (ref 150–400)
RBC: 4.61 MIL/uL (ref 4.22–5.81)
WBC: 5.2 10*3/uL (ref 4.0–10.5)

## 2010-11-27 LAB — CBC
Platelets: 190 10*3/uL (ref 150–400)
RBC: 4.69 MIL/uL (ref 4.22–5.81)
WBC: 6.4 10*3/uL (ref 4.0–10.5)

## 2010-11-27 LAB — PROTIME-INR: Prothrombin Time: 15 seconds (ref 11.6–15.2)

## 2010-11-27 LAB — DIFFERENTIAL
Basophils Absolute: 0.1 10*3/uL (ref 0.0–0.1)
Basophils Relative: 1 % (ref 0–1)
Eosinophils Absolute: 0.4 10*3/uL (ref 0.0–0.7)
Neutrophils Relative %: 48 % (ref 43–77)

## 2010-11-27 LAB — BASIC METABOLIC PANEL
BUN: 16 mg/dL (ref 6–23)
Calcium: 9.4 mg/dL (ref 8.4–10.5)
GFR calc non Af Amer: >60 mL/min (ref >60)
Glucose, Bld: 171 mg/dL — ABNORMAL HIGH (ref 70–99)
Sodium: 140 mEq/L (ref 135–145)

## 2010-11-27 LAB — GLUCOSE, CAPILLARY: Glucose-Capillary: 216 mg/dL — ABNORMAL HIGH (ref 70–99)

## 2010-11-28 LAB — PROTIME-INR: Prothrombin Time: 14.2 seconds (ref 11.6–15.2)

## 2010-11-28 LAB — CBC
Hemoglobin: 13.4 g/dL (ref 13.0–17.0)
RBC: 4.45 MIL/uL (ref 4.22–5.81)
WBC: 5.9 10*3/uL (ref 4.0–10.5)

## 2010-11-28 LAB — DIFFERENTIAL
Basophils Absolute: 0.1 10*3/uL (ref 0.0–0.1)
Basophils Relative: 1 % (ref 0–1)
Neutro Abs: 2.9 10*3/uL (ref 1.7–7.7)
Neutrophils Relative %: 49 % (ref 43–77)

## 2010-11-28 LAB — GLUCOSE, CAPILLARY
Glucose-Capillary: 181 mg/dL — ABNORMAL HIGH (ref 70–99)
Glucose-Capillary: 163 mg/dL — ABNORMAL HIGH (ref 70–99)
Glucose-Capillary: 166 mg/dL — ABNORMAL HIGH (ref 70–99)

## 2010-11-28 LAB — BASIC METABOLIC PANEL
CO2: 29 mEq/L (ref 19–32)
Calcium: 9.3 mg/dL (ref 8.4–10.5)
Chloride: 103 mEq/L (ref 96–112)
GFR calc Af Amer: >60 mL/min (ref >60)
Potassium: 4.5 mEq/L (ref 3.5–5.1)
Sodium: 139 mEq/L (ref 135–145)

## 2010-11-29 LAB — CBC
MCH: 30 pg (ref 26.0–34.0)
MCV: 91.2 fL (ref 78.0–100.0)
Platelets: 171 10*3/uL (ref 150–400)
RBC: 4.57 MIL/uL (ref 4.22–5.81)

## 2010-11-29 LAB — GLUCOSE, CAPILLARY
Glucose-Capillary: 238 mg/dL — ABNORMAL HIGH (ref 70–99)
Glucose-Capillary: 236 mg/dL — ABNORMAL HIGH (ref 70–99)
Glucose-Capillary: 243 mg/dL — ABNORMAL HIGH (ref 70–99)

## 2010-11-29 LAB — DIFFERENTIAL
Eosinophils Absolute: 0.4 10*3/uL (ref 0.0–0.7)
Lymphs Abs: 2.4 10*3/uL (ref 0.7–4.0)
Monocytes Relative: 9 % (ref 3–12)
Neutrophils Relative %: 51 % (ref 43–77)

## 2010-11-29 LAB — BASIC METABOLIC PANEL
CO2: 30 mEq/L (ref 19–32)
Chloride: 100 mEq/L (ref 96–112)
GFR calc Af Amer: >60 mL/min (ref >60)
Potassium: 4.5 mEq/L (ref 3.5–5.1)
Sodium: 137 mEq/L (ref 135–145)

## 2010-11-30 LAB — DIFFERENTIAL
Basophils Absolute: 0.1 10*3/uL (ref 0.0–0.1)
Basophils Relative: 1 % (ref 0–1)
Lymphocytes Relative: 36 % (ref 12–46)
Monocytes Absolute: 0.6 10*3/uL (ref 0.1–1.0)
Neutro Abs: 2.9 10*3/uL (ref 1.7–7.7)
Neutrophils Relative %: 48 % (ref 43–77)

## 2010-11-30 LAB — GLUCOSE, CAPILLARY
Glucose-Capillary: 183 mg/dL — ABNORMAL HIGH (ref 70–99)
Glucose-Capillary: 122 mg/dL — ABNORMAL HIGH (ref 70–99)

## 2010-11-30 LAB — PROTIME-INR
INR: 1.43 (ref 0.00–1.49)
Prothrombin Time: 17.6 seconds — ABNORMAL HIGH (ref 11.6–15.2)

## 2010-11-30 LAB — BASIC METABOLIC PANEL
CO2: 29 mEq/L (ref 19–32)
Calcium: 9.1 mg/dL (ref 8.4–10.5)
Chloride: 102 mEq/L (ref 96–112)
GFR calc Af Amer: >60 mL/min (ref >60)
Glucose, Bld: 131 mg/dL — ABNORMAL HIGH (ref 70–99)
Potassium: 4.5 mEq/L (ref 3.5–5.1)
Sodium: 138 mEq/L (ref 135–145)

## 2010-11-30 LAB — CBC
HCT: 44.7 % (ref 39.0–52.0)
Hemoglobin: 15 g/dL (ref 13.0–17.0)
MCHC: 33.6 g/dL (ref 30.0–36.0)
RBC: 4.93 MIL/uL (ref 4.22–5.81)
WBC: 6 10*3/uL (ref 4.0–10.5)

## 2010-12-01 LAB — GLUCOSE, CAPILLARY
Glucose-Capillary: 80 mg/dL (ref 70–99)
Glucose-Capillary: 136 mg/dL — ABNORMAL HIGH (ref 70–99)
Glucose-Capillary: 222 mg/dL — ABNORMAL HIGH (ref 70–99)

## 2010-12-01 LAB — DIFFERENTIAL
Basophils Absolute: 0.1 10*3/uL (ref 0.0–0.1)
Basophils Relative: 1 % (ref 0–1)
Eosinophils Absolute: 0.4 10*3/uL (ref 0.0–0.7)
Monocytes Relative: 11 % (ref 3–12)
Neutro Abs: 3.6 10*3/uL (ref 1.7–7.7)
Neutrophils Relative %: 52 % (ref 43–77)

## 2010-12-01 LAB — CBC
MCH: 30.2 pg (ref 26.0–34.0)
MCHC: 33 g/dL (ref 30.0–36.0)
Platelets: 159 10*3/uL (ref 150–400)
RBC: 4.57 MIL/uL (ref 4.22–5.81)

## 2010-12-01 LAB — BASIC METABOLIC PANEL
Calcium: 9.2 mg/dL (ref 8.4–10.5)
Creatinine, Ser: 0.92 mg/dL (ref 0.4–1.5)
GFR calc Af Amer: >60 mL/min (ref >60)
Sodium: 140 mEq/L (ref 135–145)

## 2010-12-02 LAB — GLUCOSE, CAPILLARY: Glucose-Capillary: 140 mg/dL — ABNORMAL HIGH (ref 70–99)

## 2010-12-02 LAB — BASIC METABOLIC PANEL
BUN: 13 mg/dL (ref 6–23)
GFR calc non Af Amer: >60 mL/min (ref >60)
Glucose, Bld: 115 mg/dL — ABNORMAL HIGH (ref 70–99)
Potassium: 4 mEq/L (ref 3.5–5.1)

## 2010-12-02 LAB — DIFFERENTIAL
Basophils Absolute: 0.1 10*3/uL (ref 0.0–0.1)
Basophils Relative: 1 % (ref 0–1)
Eosinophils Absolute: 0.3 10*3/uL (ref 0.0–0.7)
Eosinophils Relative: 6 % — ABNORMAL HIGH (ref 0–5)
Monocytes Absolute: 0.5 10*3/uL (ref 0.1–1.0)

## 2010-12-02 LAB — CBC
HCT: 40.9 % (ref 39.0–52.0)
MCHC: 32.5 g/dL (ref 30.0–36.0)
Platelets: 143 10*3/uL — ABNORMAL LOW (ref 150–400)
RDW: 13.2 % (ref 11.5–15.5)

## 2010-12-02 LAB — PROTIME-INR: Prothrombin Time: 18.8 seconds — ABNORMAL HIGH (ref 11.6–15.2)

## 2010-12-02 LAB — HEPARIN LEVEL (UNFRACTIONATED): Heparin Unfractionated: 0.3 IU/mL (ref 0.30–0.70)

## 2010-12-03 LAB — GLUCOSE, CAPILLARY
Glucose-Capillary: 123 mg/dL — ABNORMAL HIGH (ref 70–99)
Glucose-Capillary: 232 mg/dL — ABNORMAL HIGH (ref 70–99)

## 2010-12-03 LAB — BASIC METABOLIC PANEL
Calcium: 9.2 mg/dL (ref 8.4–10.5)
Creatinine, Ser: 0.89 mg/dL (ref 0.4–1.5)
GFR calc Af Amer: >60 mL/min (ref >60)
GFR calc non Af Amer: >60 mL/min (ref >60)

## 2010-12-03 LAB — PROTIME-INR: Prothrombin Time: 26.7 seconds — ABNORMAL HIGH (ref 11.6–15.2)

## 2010-12-03 LAB — CBC
MCH: 29.8 pg (ref 26.0–34.0)
MCHC: 32.2 g/dL (ref 30.0–36.0)
Platelets: 136 10*3/uL — ABNORMAL LOW (ref 150–400)
RBC: 4.49 MIL/uL (ref 4.22–5.81)
RDW: 13.2 % (ref 11.5–15.5)

## 2010-12-03 LAB — HEPARIN LEVEL (UNFRACTIONATED): Heparin Unfractionated: 0.52 IU/mL (ref 0.30–0.70)

## 2010-12-03 LAB — DIFFERENTIAL
Basophils Relative: 1 % (ref 0–1)
Eosinophils Absolute: 0.3 10*3/uL (ref 0.0–0.7)
Eosinophils Relative: 5 % (ref 0–5)
Monocytes Absolute: 0.7 10*3/uL (ref 0.1–1.0)
Monocytes Relative: 12 % (ref 3–12)
Neutrophils Relative %: 45 % (ref 43–77)

## 2010-12-04 LAB — CBC
MCHC: 32.3 g/dL (ref 30.0–36.0)
MCV: 92.5 fL (ref 78.0–100.0)
Platelets: 127 10*3/uL — ABNORMAL LOW (ref 150–400)
RDW: 13.2 % (ref 11.5–15.5)
WBC: 6.3 10*3/uL (ref 4.0–10.5)

## 2010-12-04 LAB — GLUCOSE, CAPILLARY
Glucose-Capillary: 86 mg/dL (ref 70–99)
Glucose-Capillary: 128 mg/dL — ABNORMAL HIGH (ref 70–99)
Glucose-Capillary: 171 mg/dL — ABNORMAL HIGH (ref 70–99)

## 2010-12-04 LAB — PROTIME-INR: INR: 3.06 — ABNORMAL HIGH (ref 0.00–1.49)

## 2010-12-04 LAB — DIFFERENTIAL
Basophils Absolute: 0.1 10*3/uL (ref 0.0–0.1)
Eosinophils Absolute: 0.4 10*3/uL (ref 0.0–0.7)
Eosinophils Relative: 6 % — ABNORMAL HIGH (ref 0–5)
Monocytes Absolute: 0.6 10*3/uL (ref 0.1–1.0)

## 2010-12-04 LAB — BASIC METABOLIC PANEL
BUN: 14 mg/dL (ref 6–23)
Chloride: 105 mEq/L (ref 96–112)
Creatinine, Ser: 1 mg/dL (ref 0.4–1.5)
Glucose, Bld: 100 mg/dL — ABNORMAL HIGH (ref 70–99)
Potassium: 4.5 mEq/L (ref 3.5–5.1)

## 2010-12-05 LAB — BASIC METABOLIC PANEL
CO2: 31 mEq/L (ref 19–32)
Calcium: 9.2 mg/dL (ref 8.4–10.5)
Creatinine, Ser: 1.07 mg/dL (ref 0.4–1.5)
GFR calc Af Amer: >60 mL/min (ref >60)
GFR calc non Af Amer: >60 mL/min (ref >60)
Sodium: 140 mEq/L (ref 135–145)

## 2010-12-05 LAB — HEPARIN LEVEL (UNFRACTIONATED): Heparin Unfractionated: 0.49 IU/mL (ref 0.30–0.70)

## 2010-12-05 LAB — DIFFERENTIAL
Basophils Absolute: 0 10*3/uL (ref 0.0–0.1)
Basophils Relative: 1 % (ref 0–1)
Eosinophils Absolute: 0.3 10*3/uL (ref 0.0–0.7)
Monocytes Relative: 9 % (ref 3–12)
Neutro Abs: 2.6 10*3/uL (ref 1.7–7.7)
Neutrophils Relative %: 48 % (ref 43–77)

## 2010-12-05 LAB — PROTIME-INR
INR: 2.99 — ABNORMAL HIGH (ref 0.00–1.49)
Prothrombin Time: 31.1 seconds — ABNORMAL HIGH (ref 11.6–15.2)

## 2010-12-05 LAB — CBC
Hemoglobin: 12.8 g/dL — ABNORMAL LOW (ref 13.0–17.0)
MCH: 29.8 pg (ref 26.0–34.0)
Platelets: 125 10*3/uL — ABNORMAL LOW (ref 150–400)
RBC: 4.3 MIL/uL (ref 4.22–5.81)
WBC: 5.4 10*3/uL (ref 4.0–10.5)

## 2010-12-05 LAB — GLUCOSE, CAPILLARY
Glucose-Capillary: 127 mg/dL — ABNORMAL HIGH (ref 70–99)
Glucose-Capillary: 218 mg/dL — ABNORMAL HIGH (ref 70–99)

## 2010-12-06 LAB — GLUCOSE, CAPILLARY
Glucose-Capillary: 102 mg/dL — ABNORMAL HIGH (ref 70–99)
Glucose-Capillary: 106 mg/dL — ABNORMAL HIGH (ref 70–99)
Glucose-Capillary: 156 mg/dL — ABNORMAL HIGH (ref 70–99)

## 2010-12-06 LAB — CBC
MCHC: 33.3 g/dL (ref 30.0–36.0)
Platelets: 116 10*3/uL — ABNORMAL LOW (ref 150–400)
RDW: 12.9 % (ref 11.5–15.5)
WBC: 5.2 10*3/uL (ref 4.0–10.5)

## 2010-12-06 LAB — BASIC METABOLIC PANEL
BUN: 19 mg/dL (ref 6–23)
Creatinine, Ser: 0.98 mg/dL (ref 0.4–1.5)
GFR calc non Af Amer: >60 mL/min (ref >60)
Glucose, Bld: 108 mg/dL — ABNORMAL HIGH (ref 70–99)

## 2010-12-06 LAB — PROTIME-INR
INR: 2.74 — ABNORMAL HIGH (ref 0.00–1.49)
Prothrombin Time: 29.1 seconds — ABNORMAL HIGH (ref 11.6–15.2)

## 2010-12-06 LAB — DIFFERENTIAL
Basophils Absolute: 0 10*3/uL (ref 0.0–0.1)
Basophils Relative: 1 % (ref 0–1)
Eosinophils Absolute: 0.3 10*3/uL (ref 0.0–0.7)
Eosinophils Relative: 6 % — ABNORMAL HIGH (ref 0–5)

## 2010-12-07 LAB — DIFFERENTIAL
Basophils Absolute: 0 10*3/uL (ref 0.0–0.1)
Basophils Relative: 1 % (ref 0–1)
Eosinophils Absolute: 0.3 10*3/uL (ref 0.0–0.7)
Monocytes Absolute: 0.6 10*3/uL (ref 0.1–1.0)
Monocytes Relative: 11 % (ref 3–12)
Neutro Abs: 2.6 10*3/uL (ref 1.7–7.7)

## 2010-12-07 LAB — GLUCOSE, CAPILLARY

## 2010-12-07 LAB — BASIC METABOLIC PANEL
BUN: 17 mg/dL (ref 6–23)
CO2: 30 mEq/L (ref 19–32)
Calcium: 8.9 mg/dL (ref 8.4–10.5)
Creatinine, Ser: 1 mg/dL (ref 0.4–1.5)
GFR calc non Af Amer: >60 mL/min (ref >60)
Glucose, Bld: 207 mg/dL — ABNORMAL HIGH (ref 70–99)
Sodium: 141 mEq/L (ref 135–145)

## 2010-12-07 LAB — PROTIME-INR: Prothrombin Time: 31.5 seconds — ABNORMAL HIGH (ref 11.6–15.2)

## 2010-12-07 LAB — CBC
HCT: 41.4 % (ref 39.0–52.0)
Hemoglobin: 13.5 g/dL (ref 13.0–17.0)
MCH: 30.1 pg (ref 26.0–34.0)
MCHC: 32.6 g/dL (ref 30.0–36.0)
MCV: 92.4 fL (ref 78.0–100.0)
Platelets: 127 10*3/uL — ABNORMAL LOW (ref 150–400)
RBC: 4.48 MIL/uL (ref 4.22–5.81)
RDW: 13.1 % (ref 11.5–15.5)
WBC: 5.7 10*3/uL (ref 4.0–10.5)

## 2010-12-07 LAB — HEPARIN LEVEL (UNFRACTIONATED): Heparin Unfractionated: <0.1 IU/mL — ABNORMAL LOW (ref 0.30–0.70)

## 2010-12-07 NOTE — Discharge Summary (Signed)
NAMEEARLY, Martin NO.:  [id number]  MEDICAL RECORD NO.:  [id number]           PATIENT TYPE:  I  LOCATION:  1615                         FACILITY:  [facility]  PHYSICIAN:  Geoffry Paradise, M.D.  DATE OF BIRTH:  1960-11-14  DATE OF ADMISSION:  11/22/2010 DATE OF DISCHARGE:                              DISCHARGE SUMMARY   DATE OF ANTICIPATED DISCHARGE:  March 19 or December 07, 2010.  DIAGNOSES AT THE TIME OF DISCHARGE: 1. Right lower extremity deep venous thrombosis with extensive clot     burden. 2. Diabetes mellitus, type 2, insulin dependent, controlled. 3. Morbid obesity. 4. Hyperlipidemia. 5. Osteoarthritis, status post right knee arthroscopy.  HISTORY OF PRESENT ILLNESS:  [name] is a 50 year old gentleman who evidently developed a right lower extremity deep venous thrombosis post knee arthroscopy for an acute meniscal tear.  He had a difficult hospital stay from February 22 to March 2 where his INR was difficult to move and ultimately he was discharged on 10 mg daily after requiring 15- 20 mg to move his INR outward.  Upon representing to the office and subsequent readmission to the hospital, he was subtherapeutic with an INR of 1.8 on 10 mg daily with extensive right lower extremity pain. Given this, he was readmitted for IV heparin, more aggressive Coumadin and consultative approach from interventional radiology for possible thrombolysis.  For details, see dictated summary by my partner Dr. Rodrigo Ran.  DATA:  The right lower extremity venous duplex persistent acute occlusive right femoral popliteal DVT that was on March 6.  On March 8 following thrombolytic therapy for several days, successful recanalization of the right femoral vein thrombolytic therapy discontinued.  Chemistries on March 19, sodium 141, potassium 4.5, chloride 104, CO2 30, glucose 108, BUN 19, creatinine 0.98, calcium 9.2. March 19, INR was 2.74.  CBC hemoglobin is 13.3,  hematocrit 40, white blood count 5.2, platelet count 116,000.  INR on March 18 was 2.99, INR on March 17 was 3.06, INR on March 16 was 2.45.  HOSPITAL COURSE:  The patient was admitted, placed on IV heparin. Interventional radiology consulted and he was placed on a tenecteplase (TNKase) infusion beginning March 7 at 5 mg per 500 mL, right lower extremity at a rate of 25 mL per hour of 0.25 mg per hour.  He was monitored in the intensive care unit and actually had improvement to the extent that the right lower extremity lessened in pain, lessened in edema.  Thrombolytic therapy was discontinued on March 7 following successful recanalization and heparin and Coumadin resumed.  Over the ensuing week, aggressive dosing of Coumadin with overlap of heparin was accomplished such that INR was well in excess of 2.0 for 4 consecutive days prior to discontinuation of heparin.  The patient was placed in a thigh-high TED hose.  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  We have had the patient ambulating, aggressively but he remains somewhat emotional over this entire episode  and fears of his INR dropping.  On the morning of this dictation, I had and again an extensive discussion with he, his wife and nursing present about the fact that this now is in the realm of outpatient intervention but he remains frightened of discharge and frustrated over the right lower extremity persistent symptoms.  He clearly has evidence of postphlebitic leg complicated by his morbid obesity and I have explained that despite the fluctuations in INR, we can easily manage this as an outpatient and this is more the risk of thromboembolic disease as apposed to right lower extremity symptomatology as this will persist.  We discussed  the fact this be a multi-month process to include weight loss, exercise and good dietary habits.  He and his wife were discussing this at length this morning and we are poised for discharge today or tomorrow with a therapeutic INR for 4 consecutive days.  We will consult social services as well to assist with any financial issues as possible.  He has remained stable from a cardiopulmonary standpoint and blood sugars have been stable as well, currently at his baseline.  He is instructed on a diet, safe for Coumadin and consistent with his diabetic needs as well.  He understands the need for weight loss and dietary compliance.  He has been fit with thigh-high TED hose and understands to wear them daytime only.  DISCHARGE MEDICATIONS: 1. Coumadin, currently dosed at 20 mg daily, has 10 mg tablets.  This     will be adjusted as an outpatient with frequent INRs in my office. 2. He is on Humalog insulin 30 units a.c. 3. He is on glyburide 10 mg b.i.d. 4. Prilosec OTC 20 mg daily. 5. Percocet 5/325 every 4 hours as needed for pain. 6. MiraLax 17 g daily for bowels. 7. Tylenol 650 q. 4 p.r.n. pain. 8. Relafen has been discontinued.He is on a no concentrated sweet Coumadin safe diet.  He will follow up with INRs in my office within 1-2 days following discharge and as I have discussed with him, should have difficulty with dips, we will supply him with some samples of Lovenox to bridge any gaps.  I reassured him that he will be safe along these lines as long as he is compliant.  I have also discussed the fact that the right lower extremity pain and symptomatology can not be fixed with continued hospitalization or any other intervention, this is a long-term intervention.          ______________________________ Geoffry Paradise, M.D.     RA/MEDQ  D:  12/06/2010  T:  12/06/2010  Job:  [id number]  Electronically Signed by Geoffry Paradise M.D. on 12/07/2010 12:43:19 PM

## 2010-12-08 NOTE — H&P (Signed)
NAME:  BEREL, NAJJAR NO.:  [id number]  MEDICAL RECORD NO.:  [id number]           PATIENT TYPE:  I  LOCATION:  1316                         FACILITY:  [facility]  PHYSICIAN:  Eleanora Guinyard A. Kaeleigh Westendorf, M.D.   DATE OF BIRTH:  [birth year]  DATE OF ADMISSION:  11/22/2010 DATE OF DISCHARGE:                             HISTORY & PHYSICAL   CHIEF COMPLAINT:  Right leg swelling and pain.  HISTORY OF PRESENT ILLNESS:  Kriston is a pleasant 50 year old gentleman who recently developed a right lower extremity deep vein thrombosis status post knee arthroscopy for an acute meniscal tear.  It was difficult during his last hospital stay from November 10, 2010, to November 19, 2010, to get his INR to move up.  He required 20 mg and 15 mg daily of Coumadin to get his INR to move.  Upon discharge, he was reduced to 10 mg daily of Coumadin thinking that there may have been a chance that we would overshoot his treatment.  He was discharged from the hospital 3 days ago.  He had a check of his INR today which was 1.8 which was somewhat subtherapeutic.  Furthermore, he reported increased pain and swelling of the right lower extremity.  He was very concerned about this and given the fact that he was subtherapeutic, we decided to readmit him to the hospital to reinstitute heparin drip and Coumadin therapy at a higher dose and to also ask Interventional Radiology for an opinion to be sure there are no other therapies for his DVT that are needed at this time.  PAST MEDICAL HISTORY: 1. Type 2 diabetes for the last 5 years. 2. Morbid obesity. 3. Hyperlipidemia. 4. Gastroesophageal reflux. 5. Past history of appendectomy, 1978. 6. Broken nose, 1985. 7. Previous left shoulder surgery.  ALLERGIES:  No known drug allergies.  MEDICATIONS:  Currently include: 1. Coumadin 10 mg daily. 2. Humalog 22 to 30 units subcu 3 times a day before meals. 3. Prilosec 20 mg daily. 4. Relafen 750 mg twice a day. 5.  Glyburide 5 mg 2 pills twice daily. 6. Percocet 5/325 two pills every 4 hours as needed for pain.  He has not been taking niacin or fish oil or meloxicam lately.  SOCIAL HISTORY:  He is married.  He has 2 sons.  He has a high school education.  He is employed by [employer], he is self-employed. No tobacco, no alcohol, no drug use.  FAMILY HISTORY:  Father died at age 33 from colon cancer.  There was a history of diabetes, arthritis, and kidney stones in the family.  Mother died at age 75 of ovarian cancer, there was also a history of hypertension, diabetes, arthritis, ulcers, and allergy on her side as well.  REVIEW OF SYSTEMS:  The patient denies chest pain or shortness of breath or fevers.  He had been more mobile the last few days since he was at home and he noticed increased swelling and he also noticed more of a knot at the right inner thigh area.  He had normal bowel movements in the last day or so and he  had backed off the Percocet to try to help his bowel function.  PHYSICAL EXAMINATION:  VITAL SIGNS:  Temperature is 97.8, blood pressure 168/92, pulse 98, respiratory rate 18, 94% saturation on room air. GENERAL:  He is semi-supine in bed in no acute distress.  He is alert and oriented x4. LUNGS:  Clear to auscultation bilaterally with no wheezes, rales, or rhonchi. HEART:  Regular rate and rhythm with no significant murmur, rub, or gallop. ABDOMEN:  Obese, but soft, nontender, nondistended. EXTREMITIES:  On the left lower extremity, he has 1+ nonpitting edema. He does have some chronic venous stasis skin changes with some hyperpigmentation of the left calf.  The right lower extremity has 2+ edema involving the foot, ankle, and calf and extending to the thigh. There is no significant redness.  The right lower extremity is slightly warm.  LABORATORY DATA:  INR earlier was 1.8, but a current INR is now 1.2. White count was 7.3, hemoglobin 13.6, platelet count  185,000.  There are 62% segs, 27% lymphocytes.  Sodium is 139, potassium 4.4, chloride 101, CO2 of 29, BUN 16, creatinine 0.98, glucose 278, GFR greater than 60, alk phos 54, AST 20, ALT 30, total protein 6.8, albumin 3.2, calcium 9.0.  ASSESSMENT AND PLAN:  A 50 year old gentleman with a right lower extremity deep vein thrombosis status post recent orthopedic procedure, who is very resistant to Coumadin.  We will admit him and place him on a heparin drip per pharmacy.  We will put him on Coumadin 20 mg this evening and then 15 mg each evening thereafter.  We will check his INR daily.  We have also asked Interventional Radiology to see the patient as well.  We will place him on bed rest currently and hopefully mobilize him more once his INR and heparin have been ongoing for 24 hours at least.  He is a full code status.     Lee-Anne Flicker A. Waynard Edwards, M.D.     MAP/MEDQ  D:  11/22/2010  T:  11/22/2010  Job:  [id number]  Electronically Signed by Rodrigo Ran M.D. on 12/08/2010 11:57:23 AM

## 2011-01-10 ENCOUNTER — Encounter (INDEPENDENT_AMBULATORY_CARE_PROVIDER_SITE_OTHER): Payer: PRIVATE HEALTH INSURANCE

## 2011-01-10 ENCOUNTER — Encounter (INDEPENDENT_AMBULATORY_CARE_PROVIDER_SITE_OTHER): Payer: PRIVATE HEALTH INSURANCE | Admitting: Surgery

## 2011-01-10 DIAGNOSIS — I80299 Phlebitis and thrombophlebitis of other deep vessels of unspecified lower extremity: Secondary | ICD-10-CM

## 2011-01-10 DIAGNOSIS — I824Z9 Acute embolism and thrombosis of unspecified deep veins of unspecified distal lower extremity: Secondary | ICD-10-CM

## 2011-01-10 DIAGNOSIS — M79609 Pain in unspecified limb: Secondary | ICD-10-CM

## 2011-01-11 NOTE — Assessment & Plan Note (Signed)
OFFICE VISIT  [name], [name] DOB:  Aug 16, 1961                                       01/10/2011 NFAOZ#:[id number]  REASON FOR VISIT:  Leg pain, history of DVT.  HISTORY:  This is a very pleasant 50 year old gentleman I am seeing at the request of Dr. Jacky Kindle for evaluation of leg pain.  The patient developed a right lower extremity DVT following an orthopedic procedure. He had an occlusive right femoral popliteal DVT and was having progressive pain and swelling that did not respond to anticoagulation. Therefore he underwent thrombolysis which was successful.  The patient had a prolonged hospital course and was discharged home.  He has been having severe pain and swelling in his right leg.  He also has swelling in his left leg.  The pain is debilitating and is preventing him from ambulation as well as with working.  He is on Coumadin.  He has ordered compression stockings but has not received them yet.  He is wearing TED hose from the hospital.  The patient suffers from hyperlipidemia which is medically managed.  He also is medically managed for his diabetes.  PAST MEDICAL HISTORY:  Diabetes, hypertension, hyperlipidemia, back pain, arthritis, DVT.  SOCIAL HISTORY:  He is married, works as a Chartered certified accountant, has 2 children. Does not drink or smoke.  FAMILY HISTORY:  Positive for colon cancer in his father.  There is also family history of arthritis and diabetes.  Mother died of ovarian cancer.  She had high blood pressure, diabetes, arthritis.  ALLERGIES:  Are none.  PHYSICAL EXAMINATION:  Vital signs:  Heart rate 76, blood pressure 143/81.  General:  This is an obese gentleman in no acute distress. HEENT:  Within normal limits.  Lungs:  Clear bilaterally. Cardiovascular:  He has palpable pedal pulses bilaterally.  Abdomen: Obese but soft.  Musculoskeletal:  No major deformities or cyanosis. Skin:  Without rash.  Neurological:  No focal deficits.   Extremities: He has significant pitting edema in both legs.  There is some calf tenderness to palpation.  DIAGNOSTIC STUDIES:  Duplex ultrasound was performed today which shows a right popliteal DVT.  The common femoral vein on the right is patent and compressible as is the left common femoral and popliteal vein.  There does appear to be some reflux in bilateral popliteal veins.  There is reflux in both saphenous veins.  ASSESSMENT AND PLAN:  Status post right leg deep venous thrombosis.  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  His symptoms are somewhat dramatic in presentation.  I am hopeful that with tight compression stockings that this will alleviate some of his problems.  My suspicion is that when he stands up (which is when most of his problems occur) is that he gets a large increase in blood flow to his legs which makes his discomfort increase.  This does go away somewhat with ambulation which is likely secondary to his calf muscle pump.  I think with tight compression he should have some relief.  He is due to get his compression stockings Friday.  I would like to see him back in 3 months with a repeat venous insufficiency exam to see if he might benefit from saphenous ablation particularly on the left and then maybe on the right depending on how his reflux is in the deep system.  Jorge Ny, MD Electronically Signed  VWB/MEDQ  D:  01/10/2011  T:  01/11/2011  Job:  [id number]  cc:   Geoffry Paradise, M.D.

## 2011-01-25 NOTE — Procedures (Unsigned)
DUPLEX DEEP VENOUS EXAM - LOWER EXTREMITY  INDICATION:  History of right lower extremity DVT.  HISTORY:  Edema:  Yes. Trauma/Surgery:  No. Pain:  Yes right and left lower extremity. PE:  No. Previous DVT:  Right femoral and popliteal DVT via venogram. Anticoagulants:  Currently taking Coumadin. Other:  DUPLEX EXAM:               CFV   SFV      PopV  PTV      GSV               R  L  R   L    R  L  R   L    R  L Thrombosis    o  o  NV  NV   +  o  NV  NV   o  o Spontaneous   +  +           o  + Phasic        +  +           o  + Augmentation  +  +           o  + Compressible  +  +           o  + Competent     +  +           o  o  Legend:  + - yes  o - no  p - partial  D - decreased   IMPRESSION: 1. Right popliteal deep venous thrombosis noted. 2. Patent and compressible right common femoral vein. 3. Patent and compressible left common femoral vein and popliteal     vein. 4. All other deep veins were unable to be visualized due to edema and     patient body habitus.        _____________________________ V. Charlena Cross, MD  EM/MEDQ  D:  01/10/2011  T:  01/10/2011  Job:  [id number]

## 2011-02-04 NOTE — Group Therapy Note (Signed)
HISTORY OF PRESENT ILLNESS:  [name] is a 50 year old male referred  for low back pain by Dr. Everardo All.  The patient has had this pain since May  2005, and was at his home working on his deck, fell off to the ground, bent  over, felt a pop in his back, and has had some pain since that time.  He  initially saw his primary physician just prior to treating it with over-the-  counter Advil.  He was tried on morphine sulfate, 15 mg b.i.d., but this  made him feel funny.  He was seen by Neurosurgery on May 07, 2004.  He  was felt to have lumbar strain.  Follow up MRI showed some degenerative  disks, multilevel, but no compressive lesion.  Pain management recommended  start on Valium, Percocet and Relafen and tried on Medrol Dosepak.  The  patient had been taking excessive amounts of ibuprofen, at least since  December 2005.  MRI March 18, 2004, showed dark disks at L3-4, L4-5 and T11-  12, as well as L1-2 with shallow bulge at L1-2.  He has been treated with  some Percocet per his primary doctor.  Takes 1-2 tablets per day, 10 mg/325.   PAST MEDICAL HISTORY:  1.  Diabetes, on Actos, Glucophage, Glyburide.  2.  Hypertension on Diazide and Zestoretic.  3.  Gout in left knee and left foot.   MEDICATIONS:  As noted, he takes up to 9 Advil at a time.   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   REVIEW OF SYSTEMS:  Positive for constipation when he takes Percocet.  Otherwise, some night sweats, high and lower blood sugars related to  diabetes.   SOCIAL HISTORY:  He is married.  Has children.   FAMILY HISTORY:  Diabetes, high blood pressure.   PHYSICAL EXAMINATION:  GENERAL APPEARANCE:  Morbidly obese male.  VITAL SIGNS:  Weight 330 pounds, 6 feet 1 inches.  Blood pressure 134/78,  pulse 87, respirations 16, O2 saturation 97% on room air.  NEUROLOGICAL:  Mood and affect appropriate.  BACK:  No tenderness to palpation.  He has full strength bilateral  upper and  lower extremities.  Normal sensation bilateral upper and lower extremities.  Normal range of motion bilateral upper and lower extremities.  Negative for  Faber's.  Lumbar spine has pain with forward flexion, so has about 75%  range, with extension about 50% range, but this is not as painful.  His knee  bilaterally has crepitus, worse on the left than on the right.  No obvious  swelling in the feet or ankles.   IMPRESSION:  1.  Lumbar degenerative disk disease, chronic low back pain.  2.  Left knee pain, secondary to gout as well as probable OA.  He has      similar pain in the right lower extremity, although, not as severe.  3.  Poor sleep related to back pain.   RECOMMENDATIONS:  1.  Needs to reduce ibuprofen intake given risks to liver, kidney and GI      mucosa, and I discussed this with the patient.  Will, instead, stop      Advil all together and start him on Diclofenac 75 b.i.d.  2.  Start Nortriptyline for sleep starting at 10 mg q.h.s. and gradually      increase to 40 over the course of 4 weeks.  3.  Physical therapy for extensor by exercise that he states extension  does      help with his pain.  4.  I do not think that he needs pain injections or are indicated at this      time, but would consider them should other measures fail.  He does not      have any significant myofascial component to his pain, and I do not      suspect      lumbar facet or SI involvement.  5.  I will see him back in one month.  Will send him for x-rays of his left      knee.  Consider left knee injections.      Erick Colace, M.D.  Electronically Signed     AEK/MedQ  D:  07/18/2005 12:28:57  T:  07/18/2005 13:11:54  Job #:  [id number]   cc:   Cristi Loron, M.D.  Fax: [phone]

## 2011-03-15 ENCOUNTER — Other Ambulatory Visit: Payer: Self-pay | Admitting: Orthopaedic Surgery

## 2011-03-15 DIAGNOSIS — M79672 Pain in left foot: Secondary | ICD-10-CM

## 2011-03-20 ENCOUNTER — Ambulatory Visit
Admission: RE | Admit: 2011-03-20 | Discharge: 2011-03-20 | Disposition: A | Discharge status: Home / Self Care | Payer: PRIVATE HEALTH INSURANCE | Source: Ambulatory Visit | Attending: Orthopaedic Surgery | Admitting: Orthopaedic Surgery

## 2011-03-20 DIAGNOSIS — M79672 Pain in left foot: Secondary | ICD-10-CM

## 2011-04-11 ENCOUNTER — Ambulatory Visit: Payer: PRIVATE HEALTH INSURANCE | Admitting: Surgery

## 2011-05-02 ENCOUNTER — Other Ambulatory Visit: Payer: Self-pay | Admitting: Gastroenterology

## 2011-05-04 ENCOUNTER — Other Ambulatory Visit: Payer: Self-pay | Admitting: Gastroenterology

## 2011-05-04 ENCOUNTER — Ambulatory Visit
Admission: RE | Admit: 2011-05-04 | Discharge: 2011-05-04 | Disposition: A | Discharge status: Home / Self Care | Payer: PRIVATE HEALTH INSURANCE | Source: Ambulatory Visit | Attending: Gastroenterology | Admitting: Gastroenterology

## 2011-05-05 ENCOUNTER — Encounter: Payer: Self-pay | Admitting: Surgery

## 2011-05-09 ENCOUNTER — Encounter: Payer: Self-pay | Admitting: Surgery

## 2011-05-09 ENCOUNTER — Encounter (INDEPENDENT_AMBULATORY_CARE_PROVIDER_SITE_OTHER): Payer: PRIVATE HEALTH INSURANCE

## 2011-05-09 ENCOUNTER — Ambulatory Visit (INDEPENDENT_AMBULATORY_CARE_PROVIDER_SITE_OTHER): Payer: PRIVATE HEALTH INSURANCE | Admitting: Surgery

## 2011-05-09 VITALS — BP 136/81 | HR 81 | Temp 97.8°F | Ht 72.0 in | Wt 350.0 lb

## 2011-05-09 DIAGNOSIS — E1159 Type 2 diabetes mellitus with other circulatory complications: Secondary | ICD-10-CM

## 2011-05-09 DIAGNOSIS — I824Z9 Acute embolism and thrombosis of unspecified deep veins of unspecified distal lower extremity: Secondary | ICD-10-CM

## 2011-05-09 DIAGNOSIS — M79609 Pain in unspecified limb: Secondary | ICD-10-CM

## 2011-05-09 DIAGNOSIS — I83893 Varicose veins of bilateral lower extremities with other complications: Secondary | ICD-10-CM

## 2011-05-09 DIAGNOSIS — I872 Venous insufficiency (chronic) (peripheral): Secondary | ICD-10-CM

## 2011-05-09 DIAGNOSIS — R609 Edema, unspecified: Secondary | ICD-10-CM

## 2011-05-09 NOTE — Progress Notes (Signed)
Subjective:     Patient ID: [name], male   DOB: 02-Dec-1960, 50 y.o.   MRN: [id number]  HPI [name] returns today for followup. I initially saw him in April of 2012 for evaluation of leg pain in the setting of a right lower extremity DVT following an orthopedic procedure. The patient had an occlusive right femoral popliteal DVT that did not respond anticoagulations. He therefore underwent successful from the lysis. He's been complaining of severe pain and swelling in his right leg as well as some swelling and discomfort in his left leg the pain is somewhat debilitating and prevents him from ambulation it is more significant when he stands up. He is being maintained on Coumadin he was put in to thigh-high compression stockings last April these do have some benefit with the swelling. He wears them every day when he gets up until he goes to bed.  Patient does have hyperlipidemia and diabetes which are medically managed.   Review of Systems     Objective:   Physical Exam  Constitutional: He appears well-developed and well-nourished.  HENT:  Head: Normocephalic and atraumatic.  Cardiovascular: Normal rate.        Palpable dorsalis pedis pulses bilaterally  No significant varicosities.  Skin:       No ulcerations   diagnostic studies: Venous reflux evaluation was performed today this shows incompetent bilateral greater saphenous vein the deep system is competent bilaterally however there is a chronic nonocclusive DVT in the distal right superficial femoral vein the lesser saphenous system is competent.     Assessment:     Chronic venous insufficiency, bilateral    Plan:     The patient continues to have pain and discomfort from bilateral edema. I believe his edema secondary to venous insufficiency as documented by his reflux evaluation today. He has been wearing his stockings for greater than 3 months with some relief. However, he still had difficulty getting back to work and being on  his feet for long periods of time. He does describe a venous claudication in the right leg. I have also recommended that the patient keep his legs elevated. We talked about ibuprofen however he has been told not to take ibuprofen while he is on Coumadin. I feel he is a good candidate for laser ablation on the left. I will defer judgment on the right to Dr. Hart Rochester given that the patient has nonocclusive DVT on the right and deep system. I will send him to see Dr. Hart Rochester in 2 weeks for discussions of laser ablation.

## 2011-05-10 ENCOUNTER — Ambulatory Visit
Admission: RE | Admit: 2011-05-10 | Discharge: 2011-05-10 | Disposition: A | Discharge status: Home / Self Care | Payer: PRIVATE HEALTH INSURANCE | Source: Ambulatory Visit | Attending: Gastroenterology | Admitting: Gastroenterology

## 2011-05-10 MED ORDER — IOHEXOL 300 MG/ML  SOLN
150.0000 mL | Freq: Once | INTRAMUSCULAR | Status: AC | PRN
  Administered 2011-05-10: 150 mL via INTRAVENOUS

## 2011-05-19 ENCOUNTER — Encounter: Payer: Self-pay | Admitting: Vascular Surgery

## 2011-05-20 ENCOUNTER — Encounter: Payer: Self-pay | Admitting: Vascular Surgery

## 2011-05-24 ENCOUNTER — Ambulatory Visit (INDEPENDENT_AMBULATORY_CARE_PROVIDER_SITE_OTHER): Payer: PRIVATE HEALTH INSURANCE | Admitting: Vascular Surgery

## 2011-05-24 VITALS — Resp 24 | Ht 72.0 in | Wt 350.0 lb

## 2011-05-24 DIAGNOSIS — I83893 Varicose veins of bilateral lower extremities with other complications: Secondary | ICD-10-CM

## 2011-05-24 DIAGNOSIS — I8010 Phlebitis and thrombophlebitis of unspecified femoral vein: Secondary | ICD-10-CM

## 2011-05-24 NOTE — Progress Notes (Addendum)
Subjective:     Patient ID: [name], male   DOB: [birth year], 50 y.o.   MRN: [id number]  HPI this 50 year old male patient returns for further evaluation regarding his bilateral lower extremity venous insufficiency. He has a history of a right leg DVT in February of this year. This was treated with, lysis. He had significant improvement in the deep system on the right but has nonocclusive DVT in the distal right superficial femoral vein on a study 2 weeks ago in our office. He also has gross reflux in both great saphenous venous systems. He has aching throbbing burning discomfort throughout both legs as well as significant edema. The morning along with elastic compression stockings 20-30 mm for the past 3 months with no improvement. At times he is having so much pain in his legs he has difficulty arising. She has no history of DVT in the left leg and no history of stasis ulcers or bleeding.   Past Medical History  Diagnosis Date  . Diabetes mellitus age 25  . Phlebitis   . Arthritis   . Hyperlipidemia   . Hypertension   . DVT (deep venous thrombosis)   . Plantar fasciitis     left foot  . Venous insufficiency (chronic) (peripheral)     History  Substance Use Topics  . Smoking status: Never Smoker   . Smokeless tobacco: Not on file  . Alcohol Use: No    Family History  Problem Relation Age of Onset  . Cancer Mother     ovarian  . Hypertension Mother   . Arthritis Mother   . Diabetes Mother   . Cancer Father     colon    No Known Allergies  Current outpatient prescriptions:gabapentin (NEURONTIN) 300 MG capsule, Take 300 mg by mouth 3 (three) times daily.  , Disp: , Rfl: ;  glyBURIDE (DIABETA) 5 MG tablet, Take 5 mg by mouth 2 (two) times daily with a meal.  , Disp: , Rfl: ;  HYDROcodone-acetaminophen (NORCO) 7.5-325 MG per tablet, Take 1 tablet by mouth.  , Disp: , Rfl: ;  Insulin Lispro, Human, (HUMALOG KWIKPEN [place]), Inject into the skin 3 (three) times daily.  , Disp: , Rfl:    Omeprazole Magnesium (PRILOSEC OTC PO), Take by mouth as needed.  , Disp: , Rfl: ;  Warfarin Sodium (COUMADIN PO), Take 12.5 mg by mouth. , Disp: , Rfl:   Resp 24  Ht 6' (1.829 m)  Wt 350 lb (158.759 kg)  BMI 47.47 kg/m2  Body mass index is 47.47 kg/(m^2).         Review of Systems     Objective:   Physical ExamResp 24  Ht 6' (1.829 m)  Wt 350 lb (158.759 kg)  BMI 47.47 kg/m2   generally he is an obese middle-aged male in no apparent distress Abdomen is soft nontender with no masses Lower extremity exam meals 3+ popliteal and dorsalis pedis pulses palpable bilaterally. Assessment:    he has bilateral edema at 1-2+ with no stasis ulcers or hyperpigmentation  Assessment-bilateral severe venous insufficiency with superficial saphenous reflux bilaterally and a history of DVT in the right leg 6 months ago. He continues to take Coumadin and has incomplete recanalization of the right deep system but it is nonocclusive.    Plan:     Believe we should proceed with laser ablation of the left great saphenous vein to see if this improves his edema and pain which are disabling to him. This has not  responded to a long-leg elastic compression stockings, elevation, and ibuprofen. If he gets a good response we may consider laser ablation of the right great saphenous system in the future. . We will proceed with pre-certification to perform laser ablation of the left great saphenous vein in the near future for his venous hypertension which is quite symptomatic.

## 2011-05-30 ENCOUNTER — Ambulatory Visit: Payer: PRIVATE HEALTH INSURANCE | Admitting: Surgery

## 2011-05-31 ENCOUNTER — Other Ambulatory Visit: Payer: Self-pay | Admitting: *Deleted

## 2011-05-31 DIAGNOSIS — I83893 Varicose veins of bilateral lower extremities with other complications: Secondary | ICD-10-CM

## 2011-06-06 ENCOUNTER — Encounter: Payer: PRIVATE HEALTH INSURANCE | Admitting: Vascular Surgery

## 2011-06-06 NOTE — Procedures (Unsigned)
LOWER EXTREMITY VENOUS REFLUX EXAM  INDICATION:  Followup pain, edema, varicose veins, history of DVT.  EXAM:  Using color-flow imaging and pulse Doppler spectral analysis, the bilateral common femoral, femoral, popliteal, posterior tibial, great and small saphenous veins were evaluated.  There is no evidence suggesting deep venous insufficiency in the bilateral lower extremities.  The bilateral saphenofemoral junctions are not competent with reflux of >500 milliseconds.  The bilateral GSVs are not competent with reflux of >500 milliseconds with the caliber as described below.  The bilateral proximal small saphenous veins demonstrate competency.  GSV Diameter (used if found to be incompetent only)                                           Right    Left Proximal Greater Saphenous Vein           0.73 cm  0.93 cm Proximal-to-mid-thigh                     0.70 cm  0.52 cm Mid thigh                                 0.68 cm  0.52 cm Mid-distal thigh                          cm       cm Distal thigh                              0.59 cm  0.49 cm Knee                                      0.41 cm  0.42 cm  IMPRESSION: 1. Technically difficult and unable to follow the bilateral     superficial femoral veins due to vessel depth and patient body     habitus. 2. Chronic nonoccluding deep vein thrombosis of the right distal     superficial femoral vein. 3. The bilateral great saphenous veins are not competent with reflux     of >500 milliseconds. 4. The bilateral great saphenous veins are not tortuous. 5. The deep venous system bilaterally is competent. 6. The bilateral small saphenous veins are competent.        ___________________________________________ V. Charlena Cross, MD  SH/MEDQ  D:  05/09/2011  T:  05/09/2011  Job:  [id number]

## 2011-06-14 ENCOUNTER — Other Ambulatory Visit: Payer: PRIVATE HEALTH INSURANCE

## 2011-06-14 ENCOUNTER — Ambulatory Visit: Payer: PRIVATE HEALTH INSURANCE | Admitting: Vascular Surgery

## 2011-06-14 NOTE — Progress Notes (Signed)
This encounter was created in error - please disregard.

## 2011-06-24 ENCOUNTER — Encounter: Payer: Self-pay | Admitting: Vascular Surgery

## 2011-06-27 ENCOUNTER — Encounter: Payer: Self-pay | Admitting: Vascular Surgery

## 2011-06-27 ENCOUNTER — Ambulatory Visit (INDEPENDENT_AMBULATORY_CARE_PROVIDER_SITE_OTHER): Payer: PRIVATE HEALTH INSURANCE | Admitting: Vascular Surgery

## 2011-06-27 VITALS — BP 176/77 | HR 100 | Resp 20 | Ht 72.0 in | Wt 350.0 lb

## 2011-06-27 DIAGNOSIS — M79609 Pain in unspecified limb: Secondary | ICD-10-CM

## 2011-06-27 DIAGNOSIS — I83893 Varicose veins of bilateral lower extremities with other complications: Secondary | ICD-10-CM

## 2011-06-27 NOTE — Progress Notes (Signed)
Addended by: Clementeen Hoof on: 06/27/2011 04:53 PM   Modules accepted: Orders

## 2011-06-27 NOTE — Progress Notes (Signed)
Subjective:     Patient ID: [name], male   DOB: [birth year], 50 y.o.   MRN: [id number]  HPI this 50 year old male patient underwent successful laser ablation of the left great saphenous vein today under local tumescent anesthesia. This was done from the mid calf to the saphenofemoral junction for venous hypertension and chronic venous insufficiency with pain in the left leg. He has had chronic edema. 2248 J were utilized .  Review of Systems     Objective:   Physical Exam blood pressure 176/77 heart rate 100 respirations 20     Assessment:     Successful well-tolerated ablation left great saphenous vein for venous hypertension with chronic edema    Plan:     Return in one week for venous duplex exam to confirm closure of left great saphenous system. Will wear a long-leg elastic compression stockings (20-30 mm) for 2 weeks.

## 2011-06-28 ENCOUNTER — Telehealth: Payer: Self-pay | Admitting: *Deleted

## 2011-06-28 NOTE — Telephone Encounter (Signed)
06/28/2011  Time: 9:48 AM   Patient Name: [name]  Patient of: Hart Rochester  Procedure:36478 L GSV  Reached Pt's wife. He had a good night. A little sore. Using ice. Can't take Ibuprofen. On another pain med. Will restart Coumadin today.  Returning next week for ultrasound and JDL visit.

## 2011-06-29 ENCOUNTER — Other Ambulatory Visit: Payer: Self-pay | Admitting: Lab

## 2011-06-29 DIAGNOSIS — I83893 Varicose veins of bilateral lower extremities with other complications: Secondary | ICD-10-CM

## 2011-07-04 ENCOUNTER — Ambulatory Visit (INDEPENDENT_AMBULATORY_CARE_PROVIDER_SITE_OTHER): Payer: PRIVATE HEALTH INSURANCE | Admitting: Vascular Surgery

## 2011-07-04 ENCOUNTER — Encounter: Payer: Self-pay | Admitting: Vascular Surgery

## 2011-07-04 ENCOUNTER — Other Ambulatory Visit (INDEPENDENT_AMBULATORY_CARE_PROVIDER_SITE_OTHER): Payer: PRIVATE HEALTH INSURANCE | Admitting: *Deleted

## 2011-07-04 VITALS — BP 142/77 | HR 91 | Resp 22 | Ht 72.0 in | Wt 350.0 lb

## 2011-07-04 DIAGNOSIS — I83893 Varicose veins of bilateral lower extremities with other complications: Secondary | ICD-10-CM

## 2011-07-04 DIAGNOSIS — Z48812 Encounter for surgical aftercare following surgery on the circulatory system: Secondary | ICD-10-CM

## 2011-07-04 NOTE — Progress Notes (Signed)
Subjective:     Patient ID: [name], male   DOB: 05-12-1961, 50 y.o.   MRN: [id number]  HPI this 50 year old male patient had laser ablation of the left great saphenous vein one week ago for severe venous hypertension and a history of pain, swelling, burning, and throbbing discomfort as the day progressed. He also was on chronic Coumadin therapy for right DVT which occurred in the past. His previous venous duplex exam revealed gross reflux in the right great saphenous vein but some nonocclusive thrombus remaining in the deep system. He had uneventful laser ablation of the left great saphenous vein last week but has had a increased amount of burning discomfort in the left groin area and just below the knee. He has been unable to take ibuprofen because of his chronic Coumadin therapy. He has taken hydrocodone. He also has tried ice and heat compresses. He is unable to wear the elastic compression stockings because of discomfort. He's had no change in distal edema. He has had no chills and fever.  Past Medical History  Diagnosis Date  . Diabetes mellitus age 76  . Phlebitis   . Arthritis   . Hyperlipidemia   . Hypertension   . DVT (deep venous thrombosis)   . Plantar fasciitis     left foot  . Venous insufficiency (chronic) (peripheral)     History  Substance Use Topics  . Smoking status: Never Smoker   . Smokeless tobacco: Never Used  . Alcohol Use: No    Family History  Problem Relation Age of Onset  . Cancer Mother     ovarian  . Hypertension Mother   . Arthritis Mother   . Diabetes Mother   . Cancer Father     colon    Not on File  Current outpatient prescriptions:gabapentin (NEURONTIN) 300 MG capsule, Take 300 mg by mouth 3 (three) times daily.  , Disp: , Rfl: ;  glyBURIDE (DIABETA) 5 MG tablet, Take 5 mg by mouth 2 (two) times daily with a meal.  , Disp: , Rfl: ;  HYDROcodone-acetaminophen (NORCO) 7.5-325 MG per tablet, Take 10 tablets by mouth every 4 (four) hours as  needed. , Disp: , Rfl:  Insulin Lispro, Human, (HUMALOG KWIKPEN [place]), Inject into the skin 3 (three) times daily.  , Disp: , Rfl: ;  Omeprazole Magnesium (PRILOSEC OTC PO), Take by mouth as needed.  , Disp: , Rfl: ;  Warfarin Sodium (COUMADIN PO), Take 12.5 mg by mouth. , Disp: , Rfl:   BP 142/77  Pulse 91  Resp 22  Ht 6' (1.829 m)  Wt 350 lb (158.759 kg)  BMI 47.47 kg/m2  Body mass index is 47.47 kg/(m^2).        Review of Systems he denies chest pain, dyspnea on exertion, PND, orthopnea     Objective:   Physical Exam pressure 142/77 heart rate 91 respirations 22 Generally he is an obese male patient who is in no apparent distress alert and oriented x3 Lungs no rhonchi or wheezing Cardiovascular regular rhythm no murmurs carotid pulses 3+ no audible bruits Lower extremity exam reveals 3+ femoral and dorsalis pedis pulses palpable bilaterally. There is moderate tenderness along the course of the left great saphenous vein from the saphenofemoral junction to the proximal calf. There is erythema surrounding the great saphenous vein area just below the knee there is less erythema in the thigh and there is no blistering of the skin. There is chronic 1+ edema.  Today I ordered a  venous duplex exam which I reviewed and interpreted. There is no DVT. The great saphenous vein is totally closed with successful ablation    Assessment:    successful ablation left great saphenous vein for venous hypertension. Significant discomfort following procedure due to localized inflammation from laser ablation.    Plan:     Continue previous measures to decrease discomfort. It appears that his symptomatology should be improving over the next few days. See him back in 3 months for continued followup. We'll then reassess right great saphenous vein although he does have nonocclusive thrombus in the deep system.

## 2011-07-11 NOTE — Procedures (Unsigned)
DUPLEX DEEP VENOUS EXAM - LOWER EXTREMITY  INDICATION:  One week followup, GSV ablation.  HISTORY:  Edema:  Yes. Trauma/Surgery:  Left GSV ablation. Pain:  Yes. PE:  No. Previous DVT:  No. Anticoagulants: Other:  DUPLEX EXAM:               CFV   SFV   PopV  PTV    GSV               R  L  R  L  R  L  R   L  R  L Thrombosis    o  o           o            + Spontaneous   +  +           +            o Phasic        +  +           +            o Augmentation  +  +           +            o Compressible  +  +                        o Competent  Legend:  + - yes  o - no  p - partial  D - decreased   IMPRESSION: 1. Left superficial femoral vein could not be evaluated due to patient     pain. 2. Moderate edema is observed on ultrasound throughout the thigh. 3. Left greater saphenous vein appears successfully ablated without     common femoral vein involvement.        _____________________________ Quita Skye. Hart Rochester, M.D.  LT/MEDQ  D:  07/04/2011  T:  07/04/2011  Job:  [id number]

## 2011-08-18 ENCOUNTER — Other Ambulatory Visit: Payer: Self-pay | Admitting: Otolaryngology

## 2011-08-19 ENCOUNTER — Other Ambulatory Visit: Payer: Self-pay | Admitting: Otolaryngology

## 2011-08-21 ENCOUNTER — Other Ambulatory Visit: Payer: PRIVATE HEALTH INSURANCE

## 2011-08-22 ENCOUNTER — Ambulatory Visit
Admission: RE | Admit: 2011-08-22 | Discharge: 2011-08-22 | Disposition: A | Discharge status: Home / Self Care | Payer: PRIVATE HEALTH INSURANCE | Source: Ambulatory Visit | Attending: Otolaryngology | Admitting: Otolaryngology

## 2011-08-22 MED ORDER — GADOBENATE DIMEGLUMINE 529 MG/ML IV SOLN
20.0000 mL | Freq: Once | INTRAVENOUS | Status: AC | PRN
  Administered 2011-08-22: 20 mL via INTRAVENOUS

## 2011-08-26 ENCOUNTER — Encounter: Payer: Self-pay | Admitting: Vascular Surgery

## 2011-08-29 DIAGNOSIS — I83893 Varicose veins of bilateral lower extremities with other complications: Secondary | ICD-10-CM

## 2011-09-06 ENCOUNTER — Ambulatory Visit: Payer: PRIVATE HEALTH INSURANCE | Admitting: Vascular Surgery

## 2011-10-10 ENCOUNTER — Encounter: Payer: Self-pay | Admitting: Vascular Surgery

## 2011-10-11 ENCOUNTER — Encounter: Payer: Self-pay | Admitting: Vascular Surgery

## 2011-10-11 ENCOUNTER — Ambulatory Visit (INDEPENDENT_AMBULATORY_CARE_PROVIDER_SITE_OTHER): Payer: PRIVATE HEALTH INSURANCE | Admitting: Vascular Surgery

## 2011-10-11 VITALS — BP 151/93 | HR 81 | Resp 22 | Ht 72.0 in | Wt 360.0 lb

## 2011-10-11 DIAGNOSIS — I872 Venous insufficiency (chronic) (peripheral): Secondary | ICD-10-CM | POA: Insufficient documentation

## 2011-10-11 NOTE — Progress Notes (Signed)
Subjective:     Patient ID: [name], male   DOB: 07-May-1961, 51 y.o.   MRN: [id number]  HPI this 51 year old male returns for further followup regarding his venous insufficiency of both lower extremities. He had laser ablation of the left great saphenous vein performed about 3 months ago he has had persistent discomfort in the inguinal area and the thigh where the ablation was performed. He has had no change in his pain which was involving the back thigh and calf area bilaterally. He states his edema is unchanged bilaterally. 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 His right leg is symptomatic and edematous due to a combination of an old DVT with chronic changes in the superficial femoral vein which are nonocclusive and gross reflux in the right great saphenous vein. He also has significant back pain  Past Medical History  Diagnosis Date  . Diabetes mellitus age 32  . Phlebitis   . Arthritis   . Hyperlipidemia   . Hypertension   . DVT (deep venous thrombosis)   . Plantar fasciitis     left foot  . Venous insufficiency (chronic) (peripheral)     History  Substance Use Topics  . Smoking status: Never Smoker   . Smokeless tobacco: Never Used  . Alcohol Use: No    Family History  Problem Relation Age of Onset  . Cancer Mother     ovarian  . Hypertension Mother   . Arthritis Mother   . Diabetes Mother   . Cancer Father     colon    No Known Allergies  Current outpatient prescriptions:glyBURIDE (DIABETA) 5 MG tablet, Take 5 mg by mouth 2 (two) times daily with a meal.  , Disp: , Rfl: ;  HYDROcodone-acetaminophen (NORCO) 10-325 MG per tablet, Take 1 tablet by mouth every 6 (six) hours as needed. One to 4 times per day, Disp: , Rfl: ;  Insulin Lispro, Human, (HUMALOG KWIKPEN [place]), Inject into the skin 3 (three) times daily.  , Disp: , Rfl:  Warfarin Sodium (COUMADIN PO), Take 12.5 mg by mouth. , Disp: , Rfl: ;  gabapentin (NEURONTIN) 300 MG capsule, Take 300 mg by mouth 3  (three) times daily.  , Disp: , Rfl: ;  HYDROcodone-acetaminophen (NORCO) 7.5-325 MG per tablet, Take 10 tablets by mouth every 4 (four) hours as needed. , Disp: , Rfl: ;  Omeprazole Magnesium (PRILOSEC OTC PO), Take by mouth as needed.  , Disp: , Rfl:   BP 151/93  Pulse 81  Resp 22  Ht 6' (1.829 m)  Wt 360 lb (163.295 kg)  BMI 48.82 kg/m2  Body mass index is 48.82 kg/(m^2).         Review of Systems denies chest pain, dyspnea on exertion, PND, orthopnea, hemoptysis biggest complaints are related to lower extremities and back     Objective:   Physical Exam blood pressure 151/93 heart rate 81 respirations 22 Gen. obese middle-aged male in no apparent distress alert and oriented x3 Lower extremity exam reveals 1+ edema throughout both legs. He does have some mild to moderate tenderness along the course of the left great saphenous vein in the distal thigh where the ablation was performed. 2+ dorsalis pedis pulses palpable bilaterally. Lungs no rhonchi or wheezing Cardiovascular regular rhythm no murmurs.     Assessment:     No improvement in symptomatology left leg following laser ablation for gross reflux left great saphenous vein with chronic edema Would not recommend treatment of contralateral  right leg because of lack of good response and left leg Wonder whether patient should have spinal stenosis or herniated disc causing symptoms in both lower extremities Would recommend evaluation by back surgeon    Plan:     Return to see [place] on a when necessary basis Continue Coumadin therapy

## 2011-11-30 IMAGING — US US EXTREM LOW VENOUS*R*
1 series · 13 of 13 positions shown · non-contrast
Comparison: None.

CLINICAL DATA: Swelling

RIGHT LOWER EXTREMITY VENOUS DUPLEX ULTRASOUND
TECHNIQUE: Gray-scale sonography with graded compression, as well
as color Doppler and duplex ultrasound, were performed to evaluate
the deep venous system of the lower extremity from the level of the
common femoral vein through the popliteal and proximal calf veins.
Spectral Doppler was utilized to evaluate flow at rest and with
distal augmentation maneuvers.

[Series 1: us extrem low venous*right* · 13 of 13 slices shown]
[im 1/13]
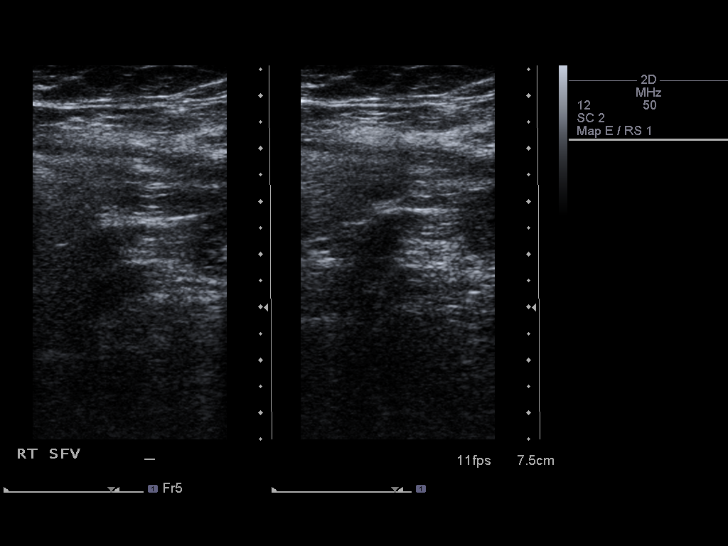
[im 2/13]
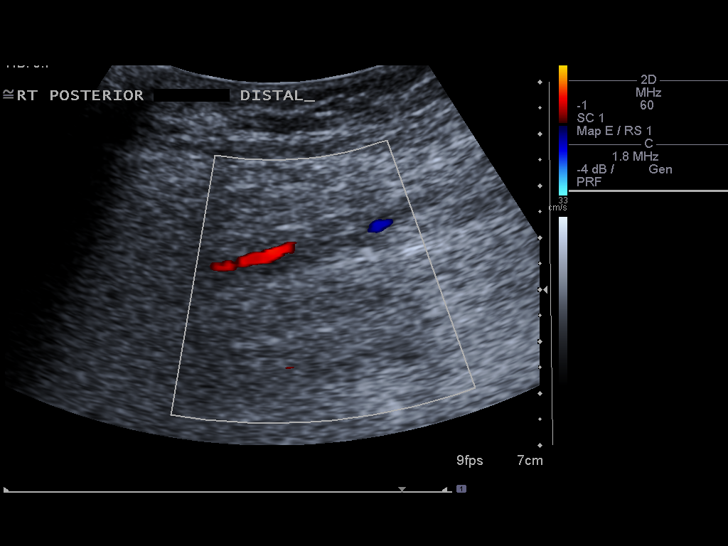
[im 3/13]
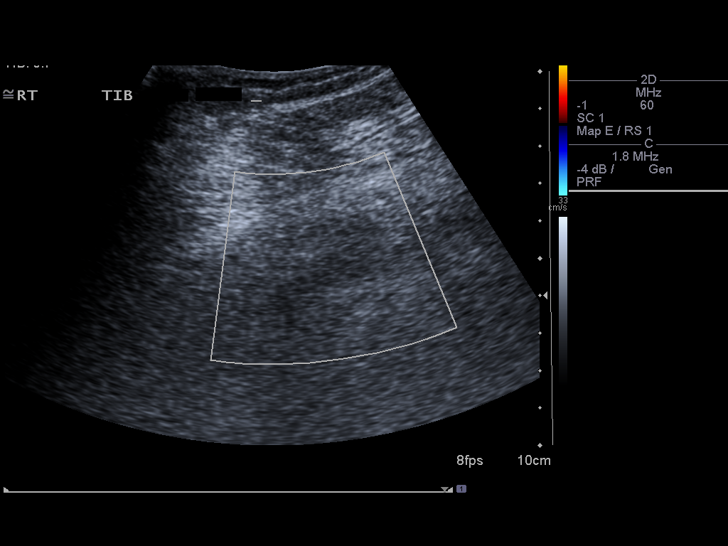
[im 4/13]
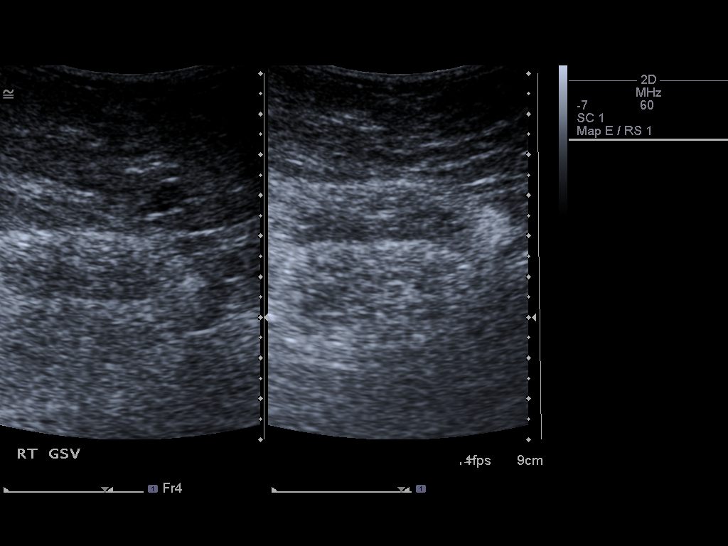
[im 5/13]
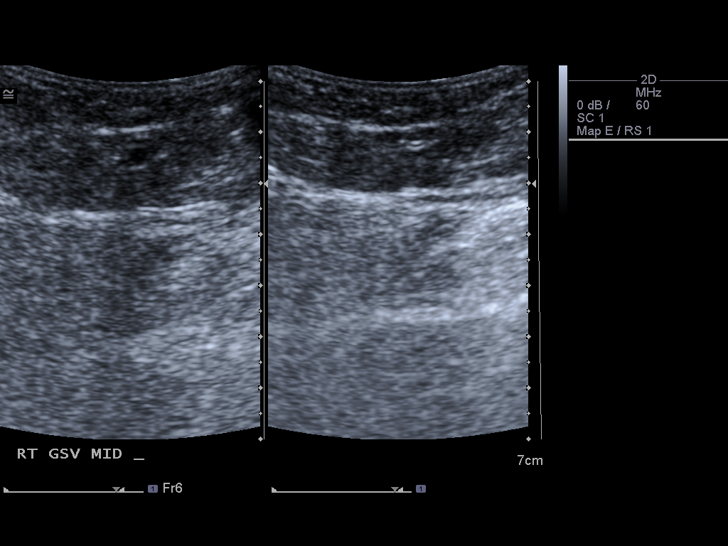
[im 6/13]
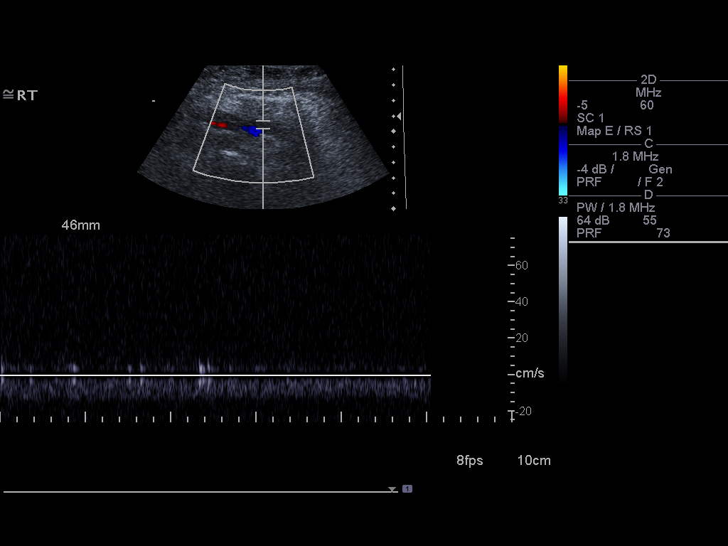
[im 7/13]
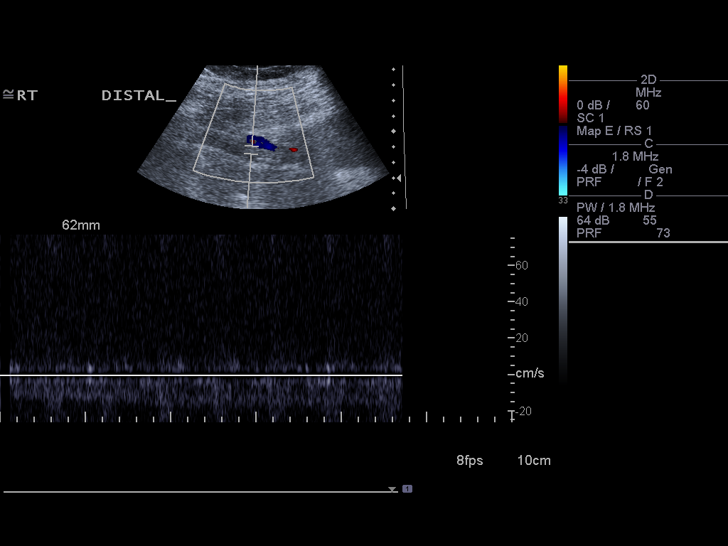
[im 8/13]
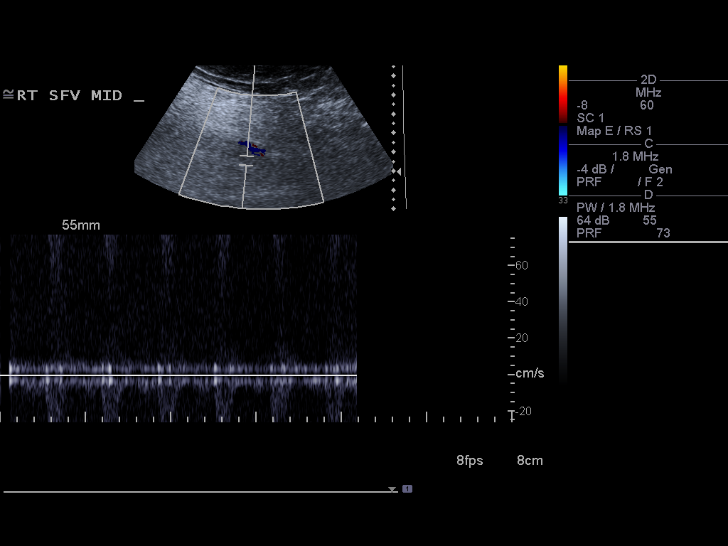
[im 9/13]
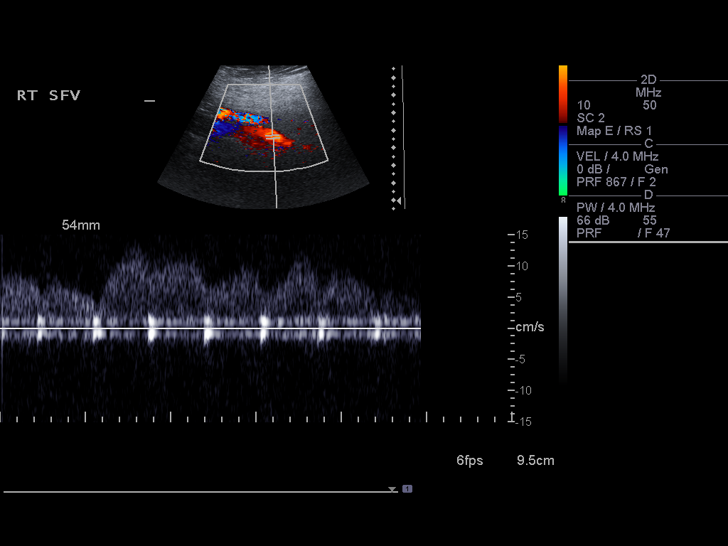
[im 10/13]
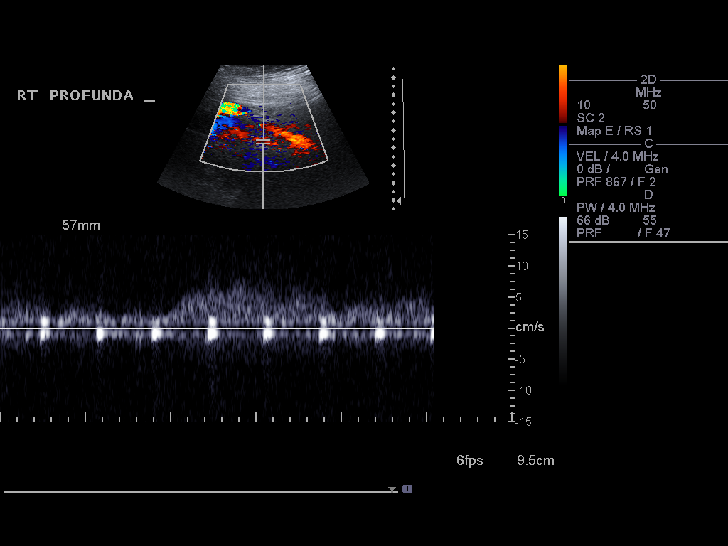
[im 11/13]
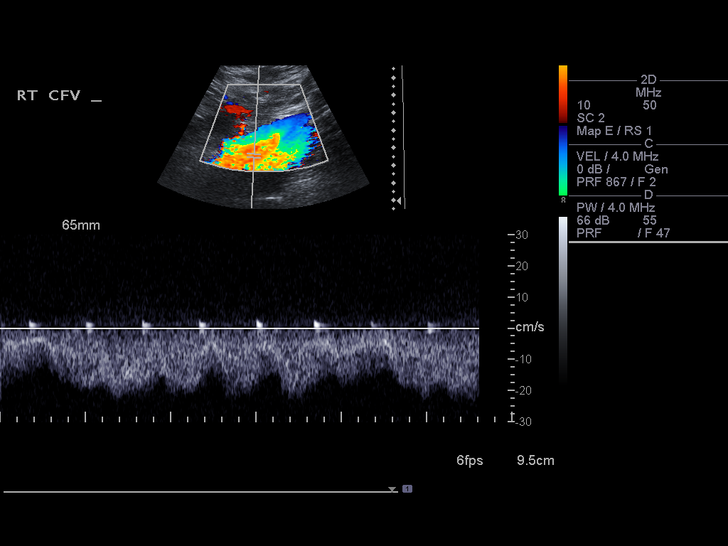
[im 12/13]
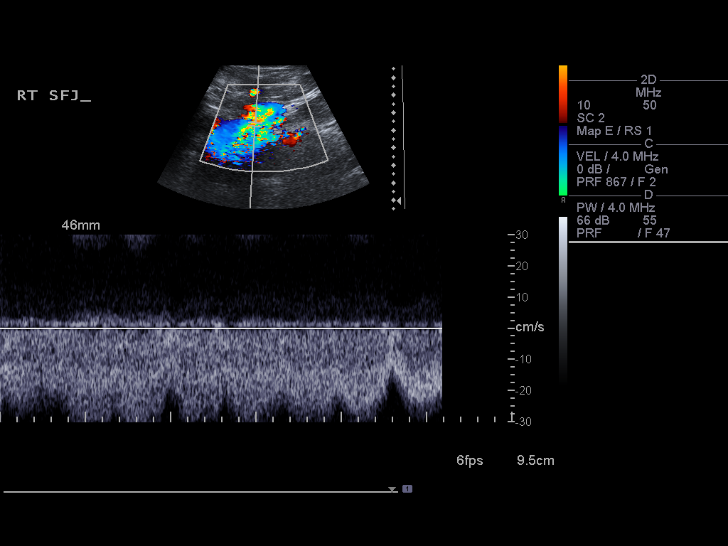
[im 13/13]
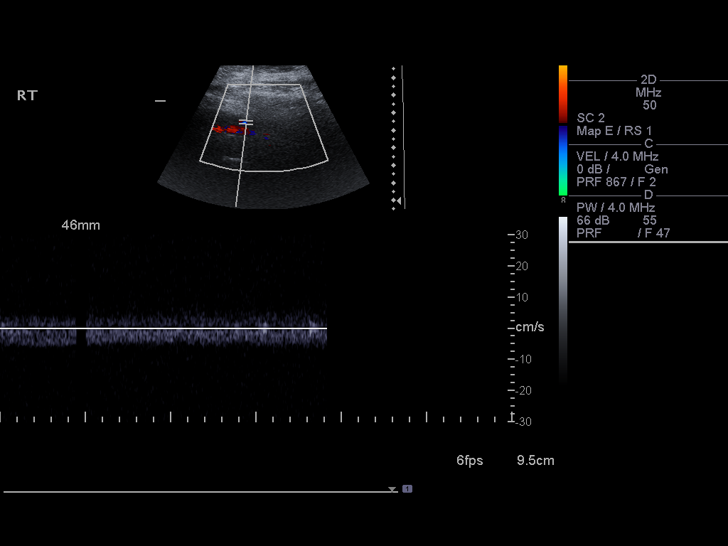

[13 of 13 positions shown; findings below may reference images not displayed]

FINDINGS: The right common femoral vein is patent and compressible.
There is noncompressible thrombus in the right femoral vein and
popliteal vein compatible with DVT.  Study is suboptimal secondary
to body habitus.  Doppler analysis of the common femoral vein
demonstrates respiratory phasicity and normal appearing venous wave
form.
IMPRESSION: Study is positive for acute DVT in the right femoral vein and
popliteal vein.

## 2011-12-13 IMAGING — US US EXTREM LOW*R* COMPLETE
1 series · 14 of 25 positions shown · non-contrast
Comparison: 11/10/2010

CLINICAL DATA: Right lower extremity DVT, morbid obesity, status
post right knee arthroscopic surgery, persistent right leg and calf
pain despite anticoagulation

RIGHT LOWER EXTREMITY VENOUS DUPLEX ULTRASOUND
TECHNIQUE: Gray-scale sonography with graded compression, as well
as color Doppler and duplex ultrasound, were performed to evaluate
the deep venous system of the lower extremity from the level of the
common femoral vein through the popliteal and proximal calf veins.
Spectral Doppler was utilized to evaluate flow at rest and with
distal augmentation maneuvers.

[Series 1: us extrem low*right* complete · 0.11mm/px · 14 of 41 slices shown]
[im 1/41]
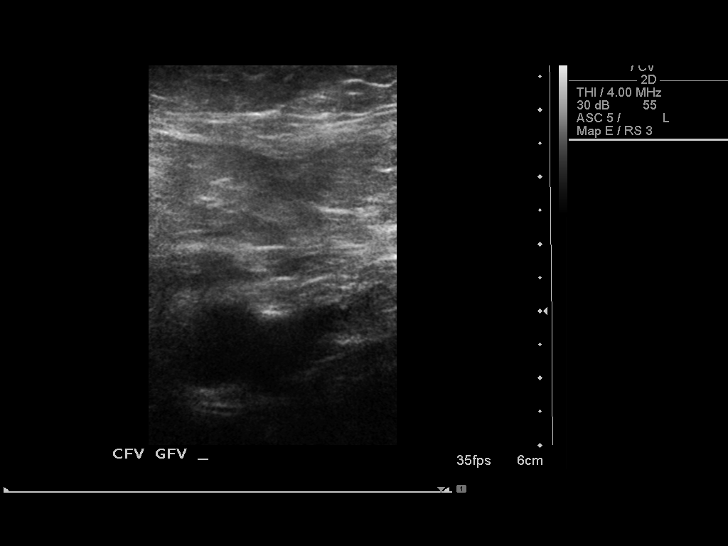
[im 4/41]
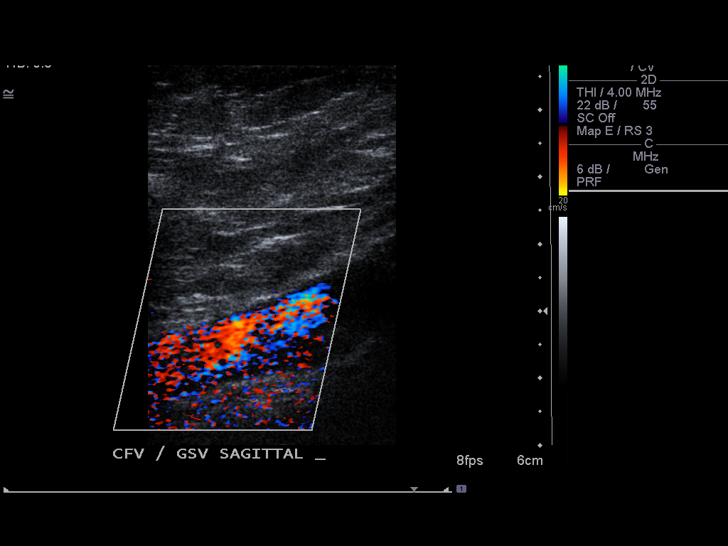
[im 7/41]
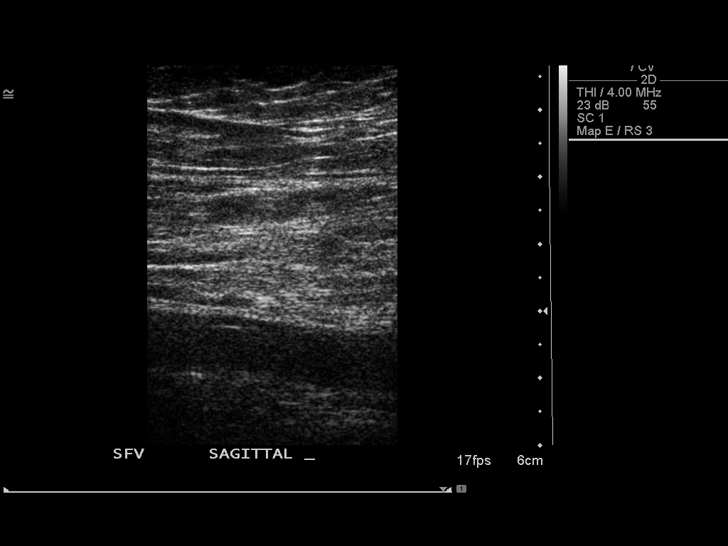
[im 11/41]
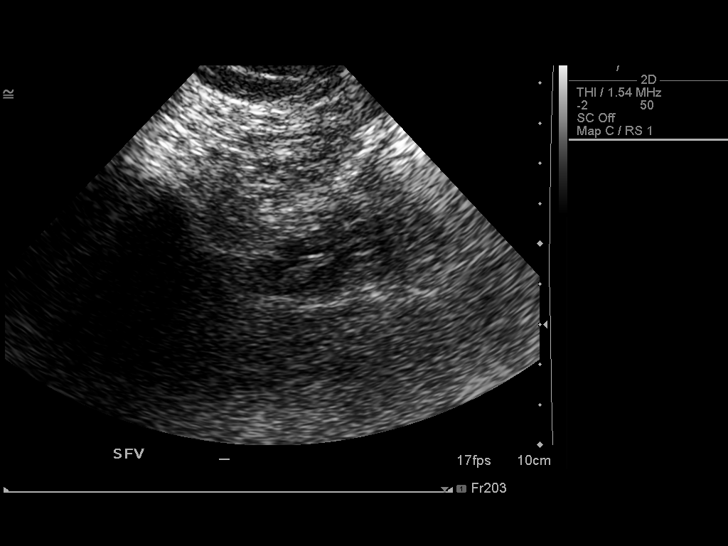
[im 14/41]
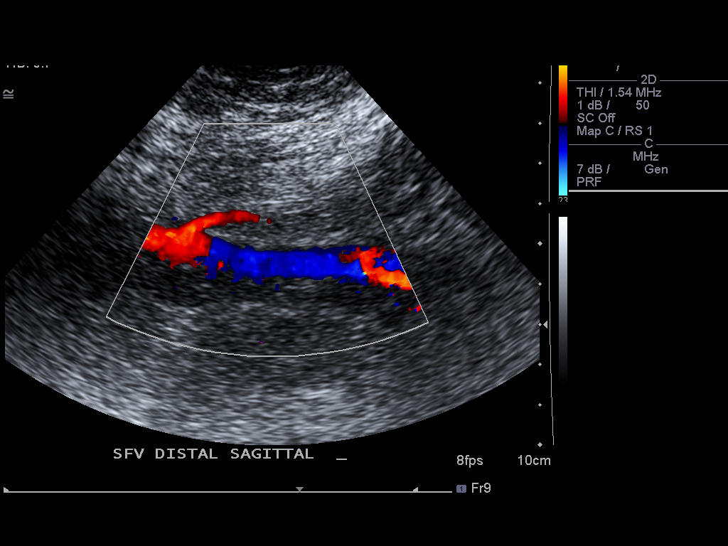
[im 16/41]
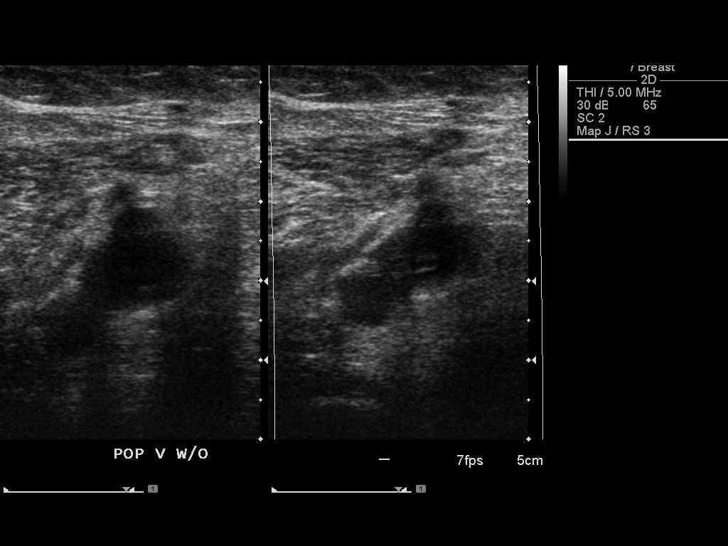
[im 19/41]
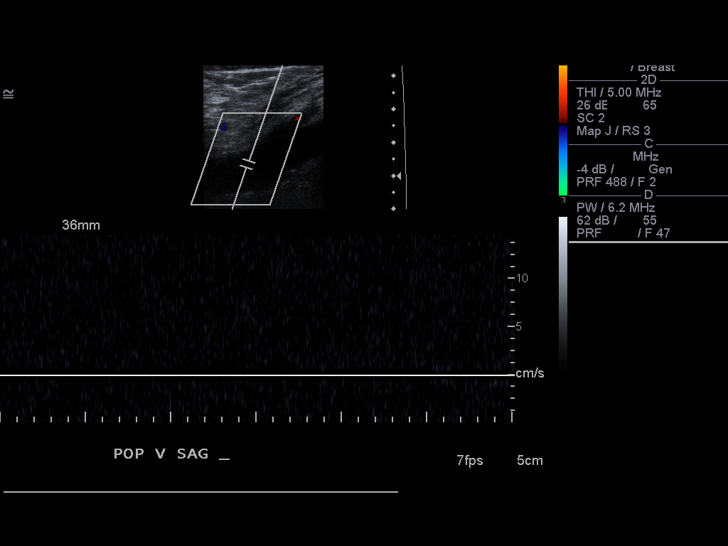
[im 22/41]
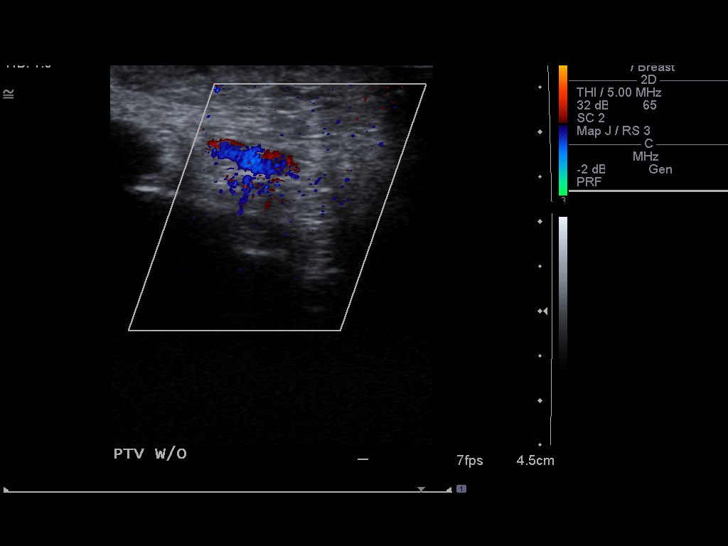
[im 26/41]
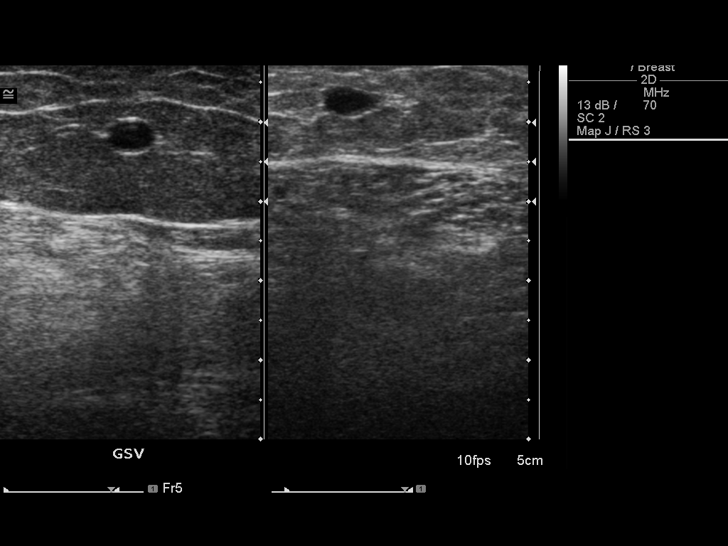
[im 27/41]
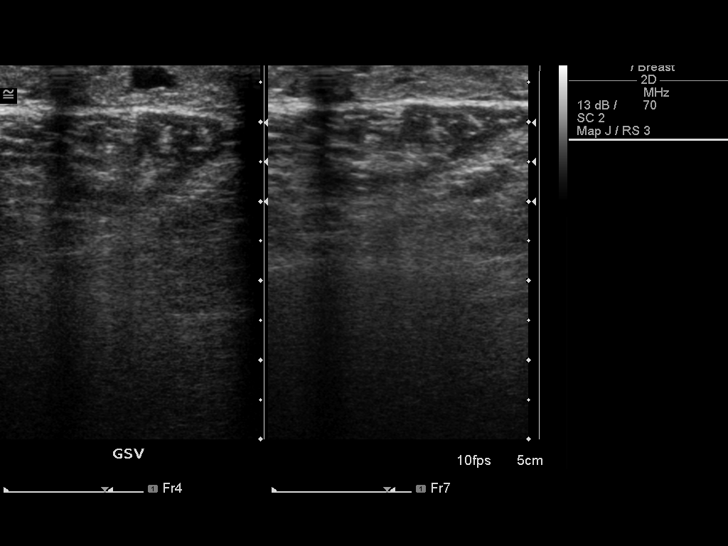
[im 31/41]
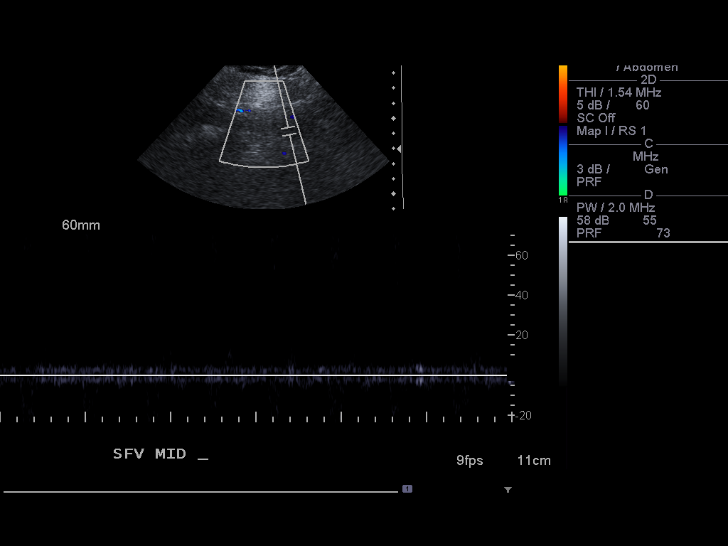
[im 34/41]
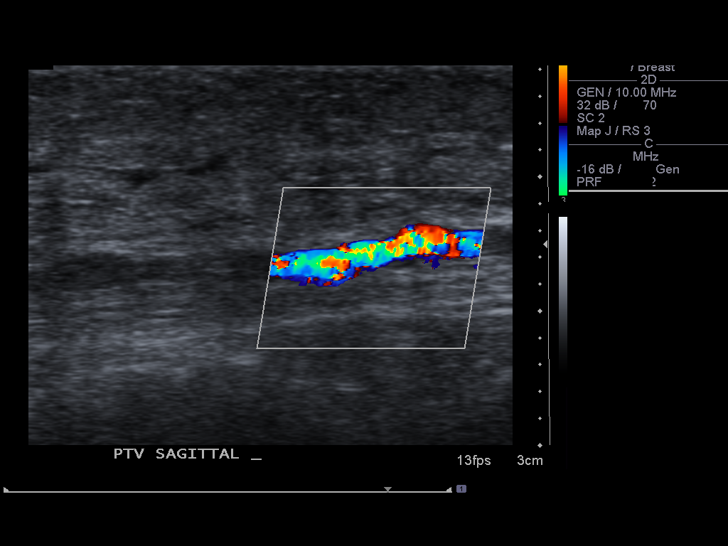
[im 37/41]
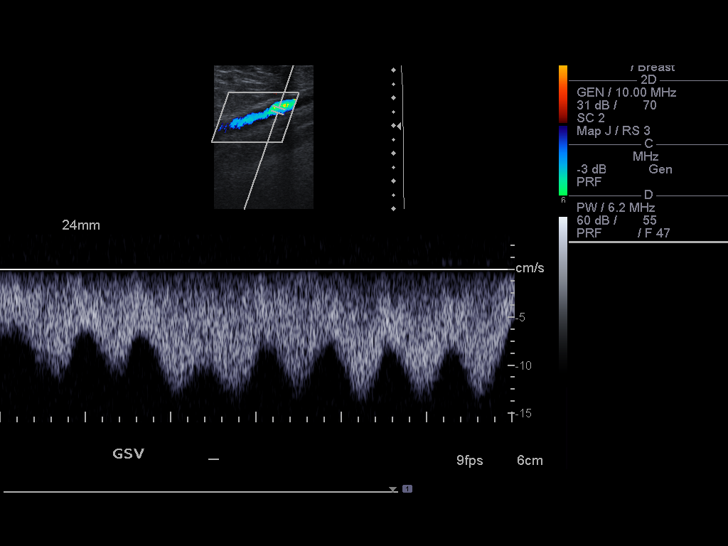
[im 41/41]
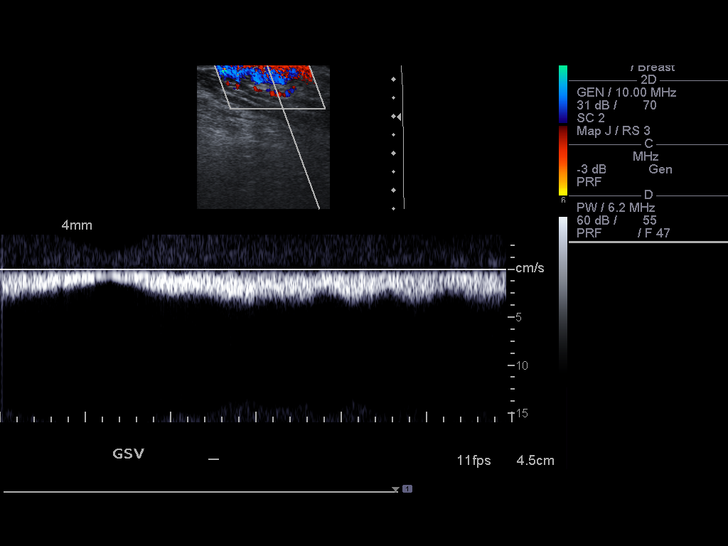

[14 of 25 positions shown; findings below may reference images not displayed]

FINDINGS: The right saphenofemoral junction and common femoral vein
are patent with normal compressibility and phasicity.  The right
deep femoral, femoral, and popliteal veins demonstrate
noncompressibility with intraluminal thrombus.  Augmentation not
performed.  Right lower extremity femoral popliteal DVT appears
occlusive.  No significant interval change compared to 11/10/2010.
IMPRESSION: Persistent acute occlusive right femoral popliteal DVT.  No
interval improvement.

## 2011-12-13 IMAGING — US IR US GUIDE VASC ACCESS RIGHT
1 series · 2 of 2 positions shown · non-contrast
Comparison: none

CLINICAL DATA: Acute occlusive right femoral popliteal DVT,
progressive right leg calf pain and swelling, not responding to
anticoagulation.

[Series 1: ir us guide vasc access*right* · 2 of 2 slices shown]
[im 1/2]
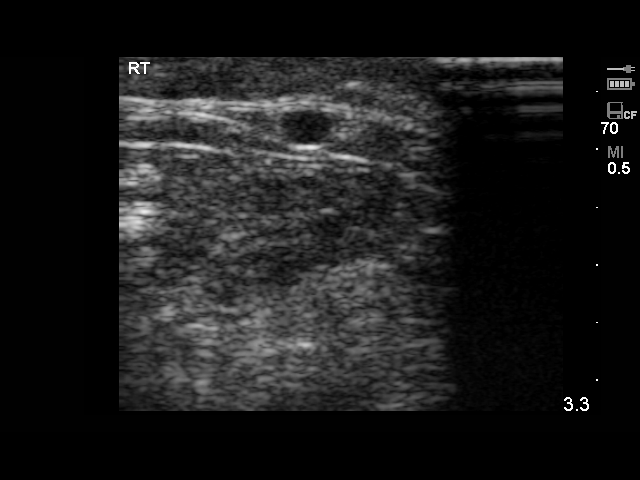
[im 2/2]
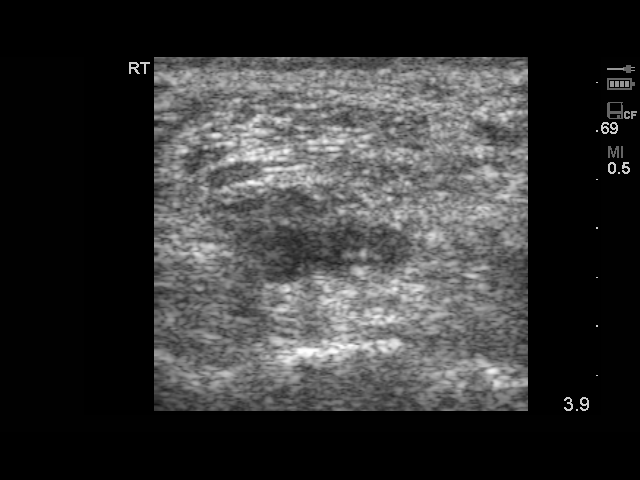

[2 of 2 positions shown; findings below may reference images not displayed]

ULTRASOUND GUIDANCE VASCULAR ACCESS
RIGHT LOWER EXTREMITY VENOGRAM
INITIATION OF RIGHT LOWER EXTREMITY VENOUS THROMBOLYSIS

Date:  11/23/2010 [DATE]

Radiologist:  Michelette Briones, M.D.

Medications:  1% lidocaine locally

Guidance:  Ultrasound fluoroscopic

Fluoroscopy time:  3.8 minutes

Sedation time:  None.

Contrast volume:  50 ml Mmnipaque-QGG

Complications:  No immediate

PROCEDURE/FINDINGS:

Informed consent was obtained from the patient following
explanation of the procedure, risks, benefits and alternatives.
The patient understands, agrees and consents for the procedure.
All questions were addressed.  A time out was performed.

Maximal barrier sterile technique utilized including caps, mask,
sterile gowns, sterile gloves, large sterile drape, hand hygiene,
and betadine

Under sterile conditions and local anesthesia, ultrasound
micropuncture access was performed of the occluded right popliteal
vein.  Needle position confirmed with ultrasound.  Images obtained
for documentation.  Guide wire advanced followed by the
transitional dilator.  This allowed exchange for a Bentson guide
wire.  A 6-French sheath was inserted over the guide wire.
Contrast was performed for an initial venogram.  This demonstrates
occlusive thrombus in the popliteal and femoral veins.  There is
stagnation of flow.  A glide wire and catheter were utilized to
manipulate the access more proximal in the femoral vein.  Complete
venogram performed.

Right lower extremity venogram:  The outflow right external iliac
vein visualized is patent.  The right common femoral and proximal
femoral vein are patent just inferior to the lesser trochanter
level.  Pullback venogram demonstrates thrombotic occlusion of the
right femoral vein and popliteal vein.

Guide wire access was re-advanced through the femoral occlusion.
Measures were obtained the appropriate length.  Over the guide
wire, a 90-cm infusion catheter with a 30-cm infusion length was
advanced over the guide wire and positioned across the femoral
popliteal occlusion.  Position confirmed with fluoroscopy.  Images
obtained for documentation.

From this position, transcatheter thrombolytic infusion will be
initiated of the occlusive right femoral popliteal DVT.

Followup venogram 11/24/2010
IMPRESSION: Right lower extremity venogram confirms acute occlusive femoral
popliteal extensive DVT.  Access was obtained for initiation of
transcatheter DVT thrombolysis.

## 2011-12-14 IMAGING — XA IR ANGIO/EXISTING CATHETER
1 series · 14 of 24 positions shown · IV contrast (IODINE)
Comparison: none

CLINICAL HISTORY: Follow up thrombolytic therapy for right lower
extremity DVT.

[Series 300: ir angio/f/u study · 14 of 39 slices shown]
[im 1/39]
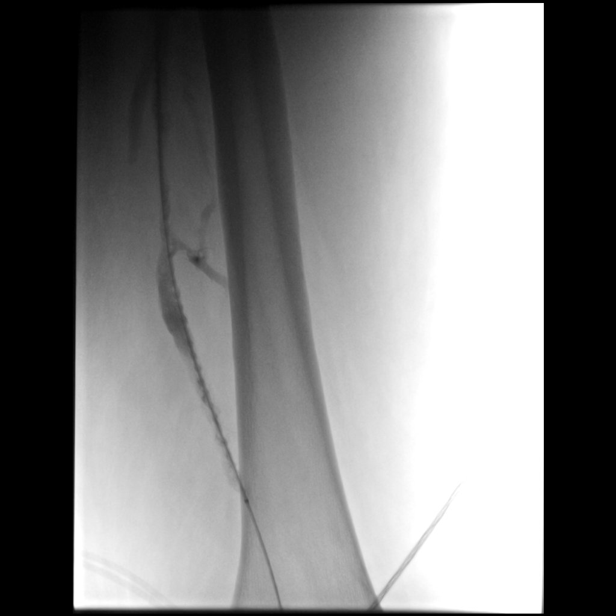
[im 4/39]
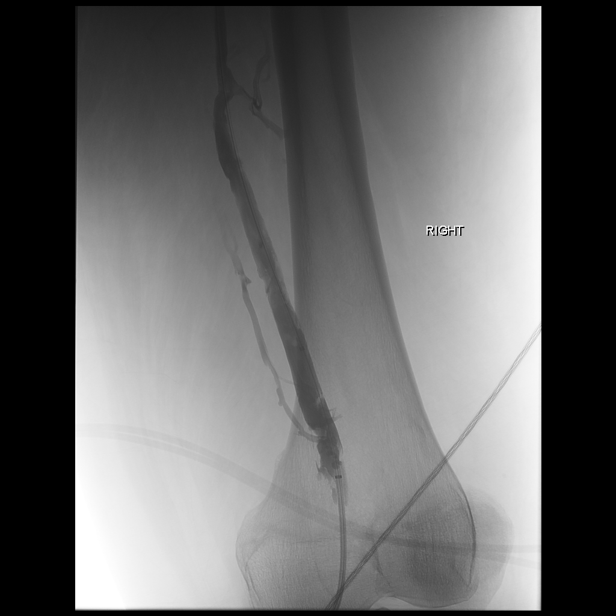
[im 7/39]
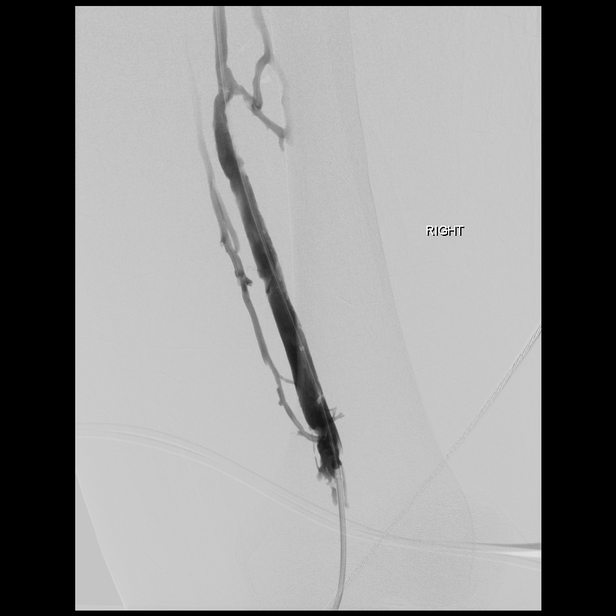
[im 10/39]
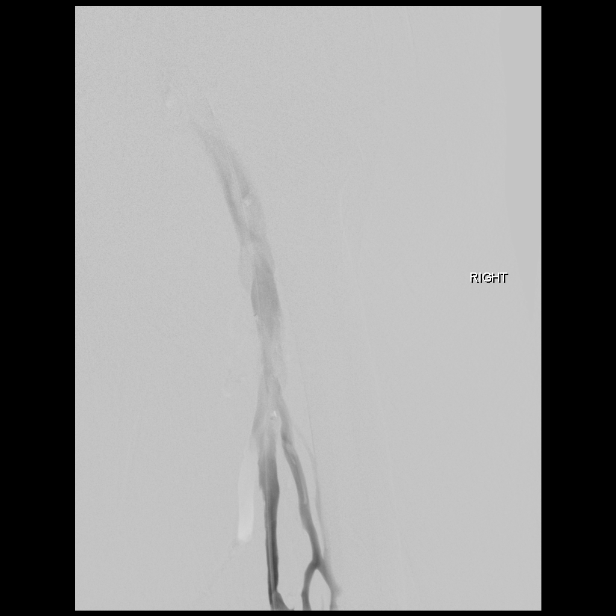
[im 12/39]
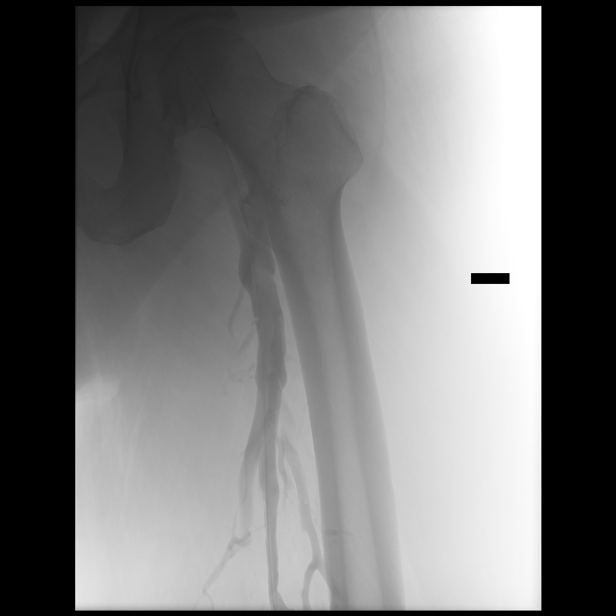
[im 15/39]
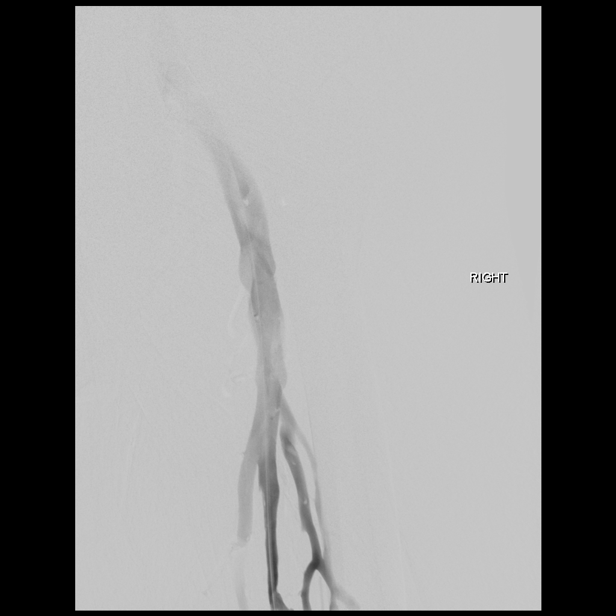
[im 19/39]
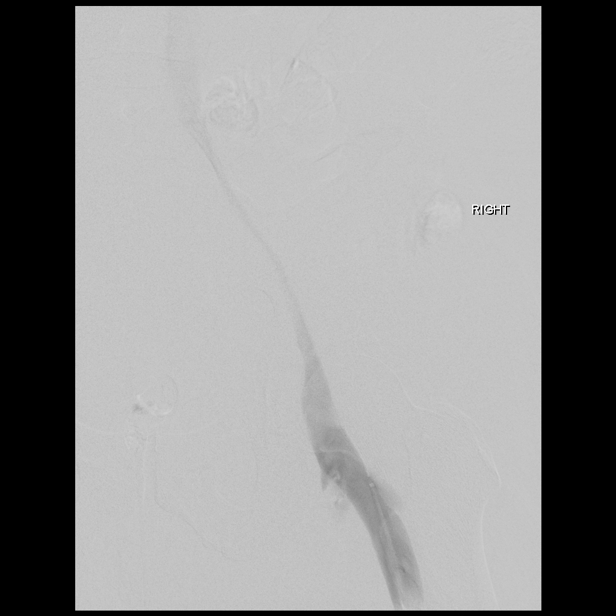
[im 20/39]
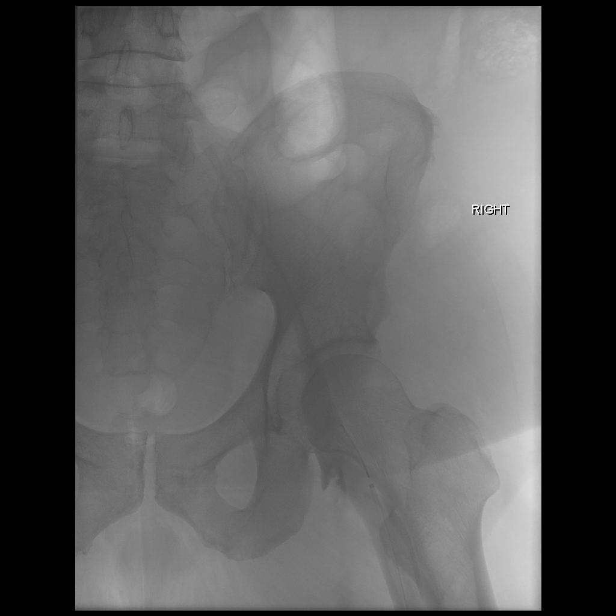
[im 24/39]
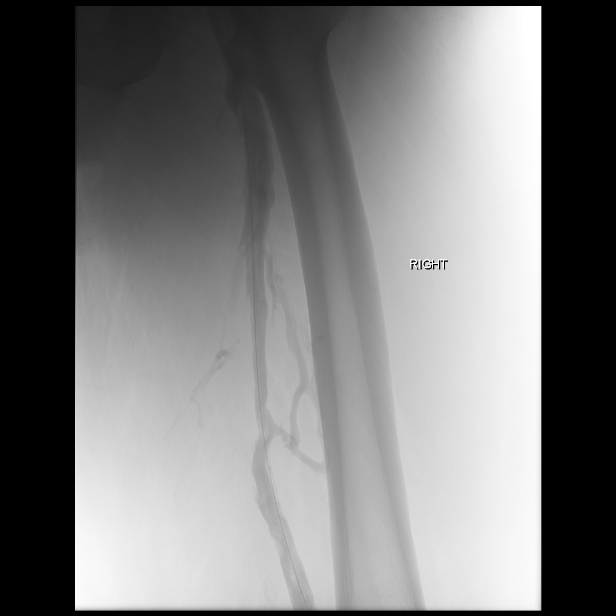
[im 27/39]
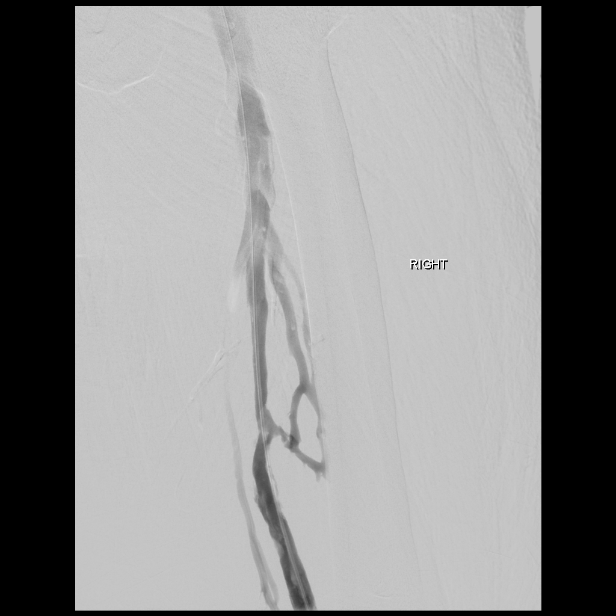
[im 30/39]
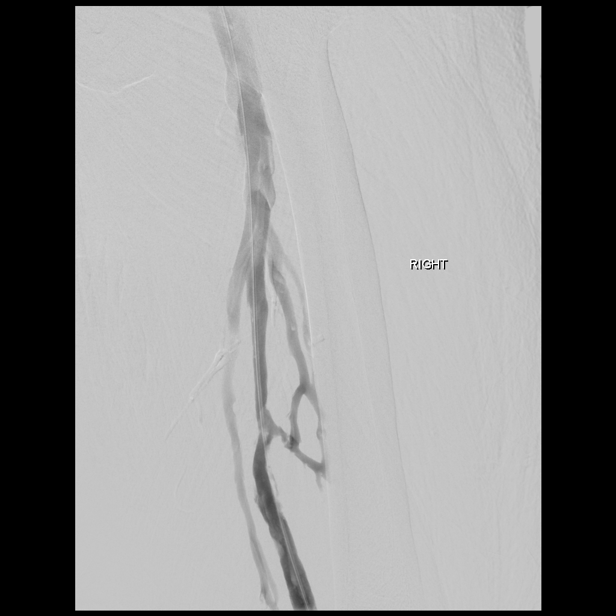
[im 32/39]
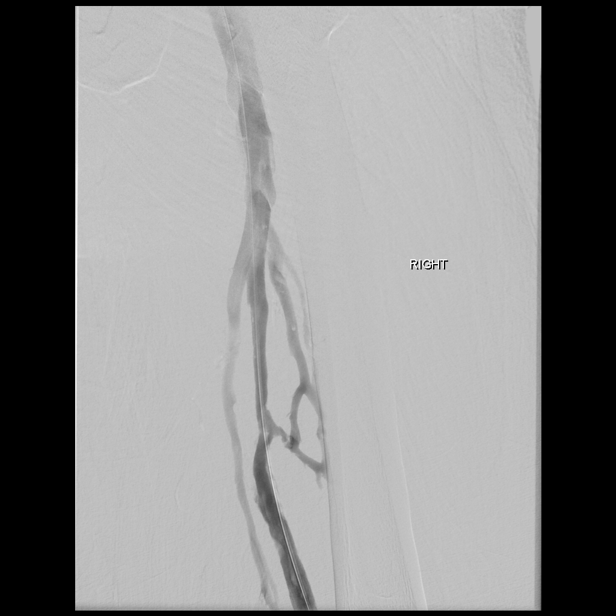
[im 35/39]
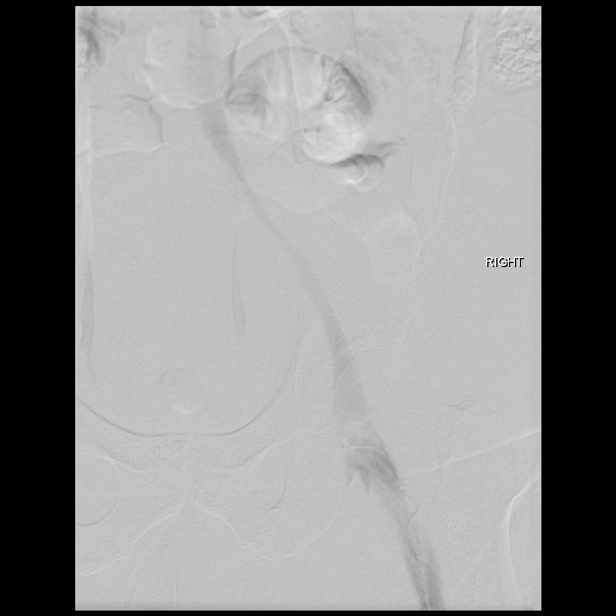
[im 39/39]
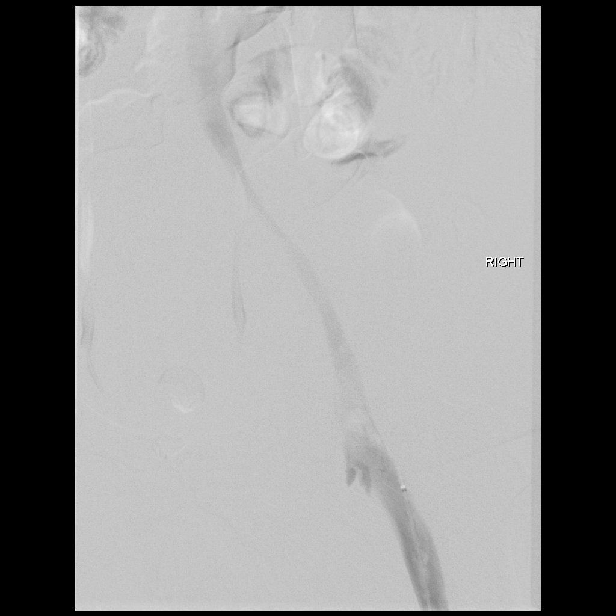

[14 of 24 positions shown; findings below may reference images not displayed]

PROCEDURE(S): RIGHT LOWER EXTREMITY VENOGRAM TO FOLLOW-UP
THROMBOLYTIC THERAPY

Medications:None

Moderate sedation time:None

Fluoroscopy time: 0.6 minutes

Contrast:  60 ml Dmnipaque-YII

Procedure:The patient had undergone thrombolytic therapy to the
right lower extremity venous system for approximately 24 hours.
The patient was placed prone on the interventional table.  Contrast
was injected through the infusion catheter.   Contrast was later
injected through the vascular sheath.  Infusion catheter was
removed. The vascular sheath was removed with manual compression.
FINDINGS: There is antegrade flow in the right femoral vein.  There
is may be a partial duplication of the right femoral vein which is
a normal variant.  The right common femoral vein remains patent.
The right iliac veins are patent.  There is mild narrowing in the
right common iliac vein and the lower IVC is patent.  There is
residual nonocclusive thrombus throughout the right femoral vein
and upper popliteal vein.

Complications: None
IMPRESSION: Successful recanalization of the right femoral vein
following thrombolytic therapy.  The thrombolytic therapy was
stopped at this time.

## 2011-12-20 DIAGNOSIS — I872 Venous insufficiency (chronic) (peripheral): Secondary | ICD-10-CM

## 2012-07-16 ENCOUNTER — Other Ambulatory Visit: Payer: Self-pay | Admitting: Physical Medicine and Rehabilitation

## 2012-07-16 DIAGNOSIS — M545 Low back pain: Secondary | ICD-10-CM

## 2012-07-17 ENCOUNTER — Ambulatory Visit
Admission: RE | Admit: 2012-07-17 | Discharge: 2012-07-17 | Disposition: A | Discharge status: Home / Self Care | Payer: PRIVATE HEALTH INSURANCE | Source: Ambulatory Visit | Attending: Physical Medicine and Rehabilitation | Admitting: Physical Medicine and Rehabilitation

## 2012-07-17 DIAGNOSIS — M545 Low back pain: Secondary | ICD-10-CM

## 2013-06-26 ENCOUNTER — Other Ambulatory Visit: Payer: Self-pay | Admitting: Internal Medicine

## 2013-06-26 DIAGNOSIS — R634 Abnormal weight loss: Secondary | ICD-10-CM

## 2013-07-01 ENCOUNTER — Other Ambulatory Visit: Payer: Self-pay | Admitting: Internal Medicine

## 2013-07-01 ENCOUNTER — Ambulatory Visit
Admission: RE | Admit: 2013-07-01 | Discharge: 2013-07-01 | Disposition: A | Discharge status: Home / Self Care | Payer: 59 | Source: Ambulatory Visit | Attending: Internal Medicine | Admitting: Internal Medicine

## 2013-07-01 ENCOUNTER — Ambulatory Visit
Admission: RE | Admit: 2013-07-01 | Discharge: 2013-07-01 | Disposition: A | Discharge status: Home / Self Care | Payer: Medicare Other | Source: Ambulatory Visit | Attending: Internal Medicine | Admitting: Internal Medicine

## 2013-07-01 DIAGNOSIS — R634 Abnormal weight loss: Secondary | ICD-10-CM

## 2013-07-01 MED ORDER — IOHEXOL 300 MG/ML  SOLN
125.0000 mL | Freq: Once | INTRAMUSCULAR | Status: AC | PRN
  Administered 2013-07-01: 125 mL via INTRAVENOUS

## 2013-07-03 ENCOUNTER — Telehealth: Payer: Self-pay | Admitting: *Deleted

## 2013-07-03 ENCOUNTER — Encounter: Payer: Self-pay | Admitting: Gastroenterology

## 2013-07-03 NOTE — Telephone Encounter (Signed)
NEEDS OV YESI IS SCHEDULING OV

## 2013-08-20 ENCOUNTER — Other Ambulatory Visit: Payer: Self-pay | Admitting: Physical Medicine and Rehabilitation

## 2013-08-20 DIAGNOSIS — M48061 Spinal stenosis, lumbar region without neurogenic claudication: Secondary | ICD-10-CM

## 2013-08-23 ENCOUNTER — Ambulatory Visit
Admission: RE | Admit: 2013-08-23 | Discharge: 2013-08-23 | Disposition: A | Discharge status: Home / Self Care | Payer: 59 | Source: Ambulatory Visit | Attending: Physical Medicine and Rehabilitation | Admitting: Physical Medicine and Rehabilitation

## 2013-08-23 DIAGNOSIS — M48061 Spinal stenosis, lumbar region without neurogenic claudication: Secondary | ICD-10-CM

## 2013-09-04 ENCOUNTER — Encounter (HOSPITAL_COMMUNITY): Payer: Self-pay | Admitting: Pharmacy Technician

## 2013-09-04 ENCOUNTER — Encounter (HOSPITAL_COMMUNITY): Payer: Self-pay | Admitting: *Deleted

## 2013-09-04 ENCOUNTER — Encounter (INDEPENDENT_AMBULATORY_CARE_PROVIDER_SITE_OTHER): Payer: Self-pay

## 2013-09-04 ENCOUNTER — Other Ambulatory Visit: Payer: Self-pay | Admitting: Gastroenterology

## 2013-09-04 ENCOUNTER — Ambulatory Visit (INDEPENDENT_AMBULATORY_CARE_PROVIDER_SITE_OTHER): Payer: 59 | Admitting: Neurology

## 2013-09-04 ENCOUNTER — Encounter: Payer: Self-pay | Admitting: Neurology

## 2013-09-04 VITALS — BP 130/83 | HR 74 | Resp 17 | Ht 72.0 in | Wt 326.0 lb

## 2013-09-04 DIAGNOSIS — G4739 Other sleep apnea: Secondary | ICD-10-CM

## 2013-09-04 DIAGNOSIS — G4736 Sleep related hypoventilation in conditions classified elsewhere: Secondary | ICD-10-CM

## 2013-09-04 DIAGNOSIS — G4733 Obstructive sleep apnea (adult) (pediatric): Secondary | ICD-10-CM

## 2013-09-04 DIAGNOSIS — G4730 Sleep apnea, unspecified: Secondary | ICD-10-CM

## 2013-09-04 NOTE — Patient Instructions (Signed)

## 2013-09-04 NOTE — Progress Notes (Addendum)
Talked to Dr. Renold Don about patient's dosage of insulin before procedure. Instructions given by Dr. Renold Don to inform patient to check sugar nite before and take usual dose of Novolog Insulin and check sugar by 9am morning of procedure and if CBG is less than 120 to drink 8oz. Gatorade before 9am . Drink the gatorade only if sugar is less than 120. Patient verbalized understanding .

## 2013-09-04 NOTE — Progress Notes (Signed)
[facility]  Provider:  Melvyn Novas, M D  Referring Provider: [name], MD Primary Care Physician:  [name], MD  Chief Complaint  [name] presents with  . Sleep Consult    SleepApnea    HPI:  [name] is a 52 y.o. male  Is seen here as a referral  from Dr. Jacky Kindle for followup on a sleep study.   [name] had been referred by Dr. Callie Fielding in November 2013  for a polysomnographic evaluation.  The [name] had endorsed the Epworth sleepiness score at 8 points,  The Beck's inventory at 6 points at that time.  Dr. Murray Hodgkins was concerned for [name],based on the narcotic pain medication used in Martin obese and also insulin-dependent diabetic [name], which may influence his breathing pattern and cause central or obstructive apnea , [name] had reported sleep diaphoresis and has been in chronic pain. [name] has a history of lower extremity  DVTs and herniated disks.   The [name] was tested on 07-23-12 and found to have moderate sleep apnea with an AHI of 24.6 (  Few obstructive apneas and plenty of  hypopneas)  and an RDI of 35.7- REM sleep void.  Lowest oxygen saturation at night was 85% of only 7.6  minutes.The heart rate was regular and normal sinus rhythm. The [name] retained carbon dioxide to a level of 52.3 torr.   The [name] slept in the non-supine position only. During the night at the sleep lab , the [name] was not able to initiate sleep in supine position but [name] tried. [name] declares today that [name] actually sleeps prone-  sleeps on his belly at home and that [name] felt a CPAP mask would never work for him anyway.The [name] has facial hair, an additional complication when fitting an interface. Martin is concerning in Martin patience who uses narcotic pain medication and has hypoventilation signs, and likely to find CPAP would  improve his AHI with little pressure needed .  Current sleep habits :  The [name] states that at time for him starts  between midnight and 2 AM and is variable, [name] will rise in the morning between 6 and 7 AM.  Overall sleep time may only be 4-5 hours at night based on Martin estimate.  Has insomnia due to pain but not necessarily a lot of nocturia breaks. [name] prefers sleeping prone or in a recliner , [name] often naps off in a recliner when watching TV - but in the sitting position noticed more choking episodes , the feeling of his airway closing up - and the sensation is distressing to him, wakes him. Martin is not the case while [name] sleeps prone in his bed.   [name] is reluctant to undergo a CPAP titration, [name] feels [name] will not be able to use the machine, and his pain management doctor is worried to continue prescribing pain medication to be taken close to bed time ,  evening and night time.    Review of Systems: Out of a complete 14 system review, the [name] complains of only the following symptoms, and all other reviewed systems are negative. The [name] endorsed weight loss, blurred vision, snoring hearing loss itching, aching muscles, joint pain, feeling hot. Snoring  is also endorsed.  History   Social History  . Marital Status: Married    Spouse Name: N/A    Number of Children: N/A  . Years of Education: N/A   Occupational History  . Not on file.  Social History Main Topics  . Smoking status: Never Smoker   . Smokeless tobacco: Never Used  . Alcohol Use: No  . Drug Use: No  . Sexual Activity: Not on file   Other Topics Concern  . Not on file   Social History Narrative  . No narrative on file    Family History  Problem Relation Age of Onset  . Cancer Mother     ovarian  . Hypertension Mother   . Arthritis Mother   . Diabetes Mother   . Cancer Father     colon    Past Medical History  Diagnosis Date  . Diabetes mellitus age 39  . Phlebitis   . Arthritis   . Hyperlipidemia   . Hypertension   . DVT (deep venous thrombosis)   . Plantar fasciitis     left foot  . Venous insufficiency  (chronic) (peripheral)     Past Surgical History  Procedure Laterality Date  . Knee surgery      right knee arthroscopy  . Appendectomy    . Shoulder surgery      left shoulder for labral tear  . Nose surgery      Current Outpatient Prescriptions  Medication Sig Dispense Refill  . colchicine 0.6 MG tablet Take 0.6 mg by mouth daily.      . DULoxetine (CYMBALTA) 60 MG capsule Take 60 mg by mouth daily.      Marland Kitchen HYDROcodone-acetaminophen (NORCO) 10-325 MG per tablet Take 1 tablet by mouth every 6 (six) hours as needed. One to 4 times per day      . insulin glargine (LANTUS) 100 UNIT/ML injection Inject 50 Units into the skin daily.      . Insulin Lispro, Human, (HUMALOG KWIKPEN [place]) Inject 40 Units into the skin 3 (three) times daily.       . Rivaroxaban (XARELTO) 20 MG TABS tablet Take 20 mg by mouth daily with supper.       No current facility-administered medications for Martin visit.    Allergies as of 09/04/2013  . (No Known Allergies)    Vitals: BP 130/83  Pulse 74  Resp 17  Ht 6' (1.829 m)  Wt 326 lb (147.873 kg)  BMI 44.20 kg/m2 Last Weight:  Wt Readings from Last 1 Encounters:  09/04/13 326 lb (147.873 kg)   Last Height:   Ht Readings from Last 1 Encounters:  09/04/13 6' (1.829 m)   Physical exam:  General: The [name] is awake, alert and appears not in acute distress. The [name] is well groomed. Head: Normocephalic, atraumatic. Neck is supple. Mallampati 3 , neck circumference: 17.5. Facial hair.  Cardiovascular:  Regular rate and rhythm , without  murmurs or carotid bruit, and without distended neck veins. Respiratory: Lungs are clear to auscultation. Skin:  Without evidence of edema, or rash Trunk: BMI is  elevated and [name]  has normal posture.  Neurologic exam : The [name] is awake and alert, oriented to place and time.  Memory subjective  described as intact.  There is a normal attention span & concentration ability. Speech is fluent without  dysarthria, dysphonia or aphasia.  Mood and affect are appropriate.  Cranial nerves: Pupils are equal and briskly reactive to light. Funduscopic exam without  evidence of pallor or edema. Extraocular movements in vertical and horizontal planes intact and without nystagmus.  Visual fields by finger perimetry are intact. Hearing to finger rub intact.  Facial sensation intact to fine touch. Facial motor strength is symmetric  and tongue moved midline.  [name] has a very narrow airway, I could not visualize the uvula, [name] has undergone a palate laser surgery in the past [name] reported , which still created little room.   Motor exam: Normal tone and muscle bulk and symmetric normal strength in all extremities.  Sensory:   Proprioception is tested in the upper extremities only. Martin was normal.  Coordination: Rapid alternating movements in the fingers/hands is tested and normal.  Finger-to-nose maneuver tested and normal without evidence of ataxia, dysmetria or tremor.  Gait and station: [name] walks without assistive device, but slow and measured, there is stiffness over the lower spine.     Stance is stable and normal.  Deep tendon reflexes: in the upper and lower extremities are symmetric and intact. Babinski maneuver response is downgoing.   Assessment:  After physical and neurologic examination, review of laboratory studies, imaging, neurophysiology testing and pre-existing records, assessment is  1) CO2 retaining [name] with a narrow airway, morbid obesity and on narcotic pain meds. Status post palatal surgery . OSA and UARS are present, as reflected in AHI and RDI. The [name] is currently untreated. Dr Murray Hodgkins has already changed the [name]'s pain management.   Plan is to titrate to CPAP. The [name] has obstructive and not central apneas, but hypoventilation is noted. I discussed with the [name] to return for a titration, which is not his preference. His question is about using an  auto-titrator at home for  30  days to give him time to learn to use the machine.  His wife is a CPAP user as well.  Nasal pillows, setting form 5 through 15 cm water , 3 cm EPR. Download in 30 days.   Plan:  Treatment plan and additional workup : if [employer] doesn't permit the auto -set, I will need the [name] to return for a  "gentle" full night titration, with desensitization time - titration may take place either in bed ( prone? ) or in recliner.    PS : Dear Dr. Murray Hodgkins.  I had a thoughtful, long visit today with our mutual [name], [name].  We discussed the results of the sleep study from November last year with him.  [name] understands that [name] has at least a moderate degree of sleep apnea and tends to breathe shallowly.  The oxygen desaturations were not for prolonged periods of time at night, but [name] clearly retained CO2, indicating hypoventilation. [name] aware that I will try to negotiate with [employer] health insurance to allow an Auto -titration, [name] was with a sat window of pressure. Is not an excellent candidate for an auto - titration, but it may be cost-saving for him. If Martin is not possible I will invite him for a night of titration and advised the sleep technician to either titrate by the [name] sleeps in a recliner or to try to find a mask that that allows him to sleep prone.    An After Visit Summary was printed and given to the [name].  Sincerely,  Melvyn Novas M.D.

## 2013-09-05 ENCOUNTER — Encounter: Payer: Self-pay | Admitting: Neurology

## 2013-09-05 DIAGNOSIS — IMO0002 Reserved for concepts with insufficient information to code with codable children: Secondary | ICD-10-CM

## 2013-09-05 DIAGNOSIS — G4733 Obstructive sleep apnea (adult) (pediatric): Secondary | ICD-10-CM | POA: Insufficient documentation

## 2013-09-05 DIAGNOSIS — G4736 Sleep related hypoventilation in conditions classified elsewhere: Secondary | ICD-10-CM | POA: Insufficient documentation

## 2013-09-05 HISTORY — DX: Reserved for concepts with insufficient information to code with codable children: IMO0002

## 2013-09-06 ENCOUNTER — Encounter (HOSPITAL_COMMUNITY): Payer: 59 | Admitting: Anesthesiology

## 2013-09-06 ENCOUNTER — Ambulatory Visit (HOSPITAL_COMMUNITY): Payer: 59 | Admitting: Anesthesiology

## 2013-09-06 ENCOUNTER — Encounter (HOSPITAL_COMMUNITY): Payer: Self-pay | Admitting: Gastroenterology

## 2013-09-06 ENCOUNTER — Ambulatory Visit (HOSPITAL_COMMUNITY)
Admission: RE | Admit: 2013-09-06 | Discharge: 2013-09-06 | Disposition: A | Discharge status: Home / Self Care | Payer: 59 | Source: Ambulatory Visit | Attending: Gastroenterology | Admitting: Gastroenterology

## 2013-09-06 ENCOUNTER — Encounter (HOSPITAL_COMMUNITY)
Admission: RE | Disposition: A | Discharge status: Home / Self Care | Payer: Self-pay | Source: Ambulatory Visit | Attending: Gastroenterology

## 2013-09-06 DIAGNOSIS — I10 Essential (primary) hypertension: Secondary | ICD-10-CM | POA: Diagnosis not present

## 2013-09-06 DIAGNOSIS — K648 Other hemorrhoids: Secondary | ICD-10-CM | POA: Diagnosis not present

## 2013-09-06 DIAGNOSIS — G4730 Sleep apnea, unspecified: Secondary | ICD-10-CM | POA: Diagnosis not present

## 2013-09-06 DIAGNOSIS — Z860100 Personal history of colon polyps, unspecified: Secondary | ICD-10-CM | POA: Insufficient documentation

## 2013-09-06 DIAGNOSIS — K7689 Other specified diseases of liver: Secondary | ICD-10-CM | POA: Diagnosis not present

## 2013-09-06 DIAGNOSIS — K921 Melena: Secondary | ICD-10-CM | POA: Diagnosis present

## 2013-09-06 DIAGNOSIS — K644 Residual hemorrhoidal skin tags: Secondary | ICD-10-CM | POA: Diagnosis not present

## 2013-09-06 DIAGNOSIS — Z800 Family history of malignant neoplasm of digestive organs: Secondary | ICD-10-CM | POA: Diagnosis not present

## 2013-09-06 DIAGNOSIS — Z794 Long term (current) use of insulin: Secondary | ICD-10-CM | POA: Insufficient documentation

## 2013-09-06 DIAGNOSIS — E119 Type 2 diabetes mellitus without complications: Secondary | ICD-10-CM | POA: Insufficient documentation

## 2013-09-06 DIAGNOSIS — K219 Gastro-esophageal reflux disease without esophagitis: Secondary | ICD-10-CM | POA: Diagnosis not present

## 2013-09-06 HISTORY — PX: COLONOSCOPY: SHX5424

## 2013-09-06 SURGERY — COLONOSCOPY
Anesthesia: Monitor Anesthesia Care

## 2013-09-06 MED ORDER — LACTATED RINGERS IV SOLN
INTRAVENOUS | Status: DC | PRN
  Administered 2013-09-06 (×2): via INTRAVENOUS

## 2013-09-06 MED ORDER — PROPOFOL 10 MG/ML IV BOLUS
INTRAVENOUS | Status: AC
  Filled 2013-09-06: qty 20

## 2013-09-06 MED ORDER — PROPOFOL INFUSION 10 MG/ML OPTIME
INTRAVENOUS | Status: DC | PRN
  Administered 2013-09-06: 120 ug/kg/min via INTRAVENOUS

## 2013-09-06 MED ORDER — FENTANYL CITRATE 0.05 MG/ML IJ SOLN: 25.0000 ug | INTRAMUSCULAR | Status: DC | PRN

## 2013-09-06 MED ORDER — SODIUM CHLORIDE 0.9 % IV SOLN: INTRAVENOUS | Status: DC

## 2013-09-06 MED ORDER — LACTATED RINGERS IV SOLN: INTRAVENOUS | Status: DC

## 2013-09-06 NOTE — Op Note (Signed)
[facility] [address] [place], [address]   COLONOSCOPY PROCEDURE REPORT  PATIENT: [name], [name]  MR#: [id number] BIRTHDATE: 10/30/1960 , 52  yrs. old GENDER: Male ENDOSCOPIST: Vida Rigger, MD REFERRED [id number] Jacky Kindle, M.D. PROCEDURE DATE:  09/06/2013 PROCEDURE:   Colonoscopy, diagnostic ASA CLASS:   Class III INDICATIONS:Patient's immediate family history of colon cancer and hematochezia. MEDICATIONS: propofol (Diprivan) 600mg  IV  DESCRIPTION OF PROCEDURE:   After the risks benefits and alternatives of the procedure were thoroughly explained, informed consent was obtained.  The Pentax Adult Colonscope [id number] endoscope was introduced through the anus and advanced to the cecum, which was identified by both the appendix and ileocecal valve , limited by No adverse events experienced.   The quality of the prep was fairly adequate and did require lots of washing and suction. .  The instrument was then slowly withdrawn as the colon was fully examined.to  advance to the cecum required abdominal pressure but no position change and no abnormalities were seen on insertion or slow withdrawal anal rectal pull-through in retroflexion was done on both insertion and withdrawal and the findings are recorded below and the patient tolerated the procedure well there was no obvious immediate complication and the scope was reinserted a short ways up  the left side of the colon at the end of the procedure and air and water were suctioned         FINDINGS: 1. Small internal/external hemorrhoids questionably small fissures 2. Otherwise within normal limits to the cecum  COMPLICATIONS: no  IMPRESSION:  above  RECOMMENDATIONS: repeat colon screening in 5 years trial of creams or suppositories or foams and happy to see back when necessary and consider surgical consult for hemorrhoids if symptoms continue  _______________________________ eSigned:  Vida Rigger, MD  09/06/2013 12:40 PM   [id number] Jacky Kindle, MD

## 2013-09-06 NOTE — Progress Notes (Signed)
[place] W Grade 11:44 AM  Subjective: Patient with multiple anal rectal complaints and multiple other medical problem and due for colonoscopy for his history of polyps and no new complaints since we've seen him in the office  Objective: Vital signs stable afebrile no acute distress exam please see pre-assessment evaluation  Assessment: Anal rectal complaints in a patient with history of colon polyp  Plan: Okay to proceed with colonoscopy with anesthesia assistance today  [facility]

## 2013-09-06 NOTE — Transfer of Care (Signed)
Immediate Anesthesia Transfer of Care Note  Patient: [name]  Procedure(s) Performed: Procedure(s): COLONOSCOPY (N/A)  Patient Location: PACU  Anesthesia Type:MAC  Level of Consciousness: awake, alert  and oriented  Airway & Oxygen Therapy: Patient Spontanous Breathing and Patient connected to face mask oxygen  Post-op Assessment: Report given to PACU RN and Post -op Vital signs reviewed and stable  Post vital signs: Reviewed and stable  Complications: No apparent anesthesia complications

## 2013-09-06 NOTE — Anesthesia Postprocedure Evaluation (Signed)
  Anesthesia Post-op Note  Patient: [name]  Procedure(s) Performed: Procedure(s) (LRB): COLONOSCOPY (N/A)  Patient Location: PACU  Anesthesia Type: MAC  Level of Consciousness: awake and alert   Airway and Oxygen Therapy: Patient Spontanous Breathing  Post-op Pain: mild  Post-op Assessment: Post-op Vital signs reviewed, Patient's Cardiovascular Status Stable, Respiratory Function Stable, Patent Airway and No signs of Nausea or vomiting  Last Vitals:  Filed Vitals:   09/06/13 1300  BP: 161/102  Pulse: 76  Temp:   Resp: 26    Post-op Vital Signs: stable   Complications: No apparent anesthesia complications

## 2013-09-06 NOTE — Anesthesia Preprocedure Evaluation (Addendum)
Anesthesia Evaluation  Patient identified by MRN, date of birth, ID band Patient awake    Reviewed: Allergy & Precautions, H&P , NPO status , Patient's Chart, lab work & pertinent test results  Airway Mallampati: II TM Distance: >3 FB Neck ROM: full    Dental no notable dental hx. (+) Teeth Intact and Dental Advisory Given   Pulmonary sleep apnea ,  breath sounds clear to auscultation  Pulmonary exam normal       Cardiovascular Exercise Tolerance: Good hypertension, Rhythm:regular Rate:Normal     Neuro/Psych negative neurological ROS  negative psych ROS   GI/Hepatic negative GI ROS, Neg liver ROS, GERD-  Controlled,Chronic non-alcoholic liver disease   Endo/Other  diabetes, Well Controlled, Type 2, Insulin DependentMorbid obesity  Renal/GU negative Renal ROS  negative genitourinary   Musculoskeletal   Abdominal (+) + obese,   Peds  Hematology negative hematology ROS (+)   Anesthesia Other Findings   Reproductive/Obstetrics negative OB ROS                         Anesthesia Physical Anesthesia Plan  ASA: III  Anesthesia Plan: MAC   Post-op Pain Management:    Induction:   Airway Management Planned: Simple Face Mask  Additional Equipment:   Intra-op Plan:   Post-operative Plan:   Informed Consent: I have reviewed the patients History and Physical, chart, labs and discussed the procedure including the risks, benefits and alternatives for the proposed anesthesia with the patient or authorized representative who has indicated his/her understanding and acceptance.   Dental Advisory Given  Plan Discussed with: CRNA and Surgeon  Anesthesia Plan Comments:         Anesthesia Quick Evaluation

## 2013-09-09 ENCOUNTER — Encounter (HOSPITAL_COMMUNITY): Payer: Self-pay | Admitting: Gastroenterology

## 2013-12-23 ENCOUNTER — Other Ambulatory Visit: Payer: Self-pay | Admitting: Orthopaedic Surgery

## 2013-12-23 DIAGNOSIS — M25511 Pain in right shoulder: Secondary | ICD-10-CM

## 2013-12-31 ENCOUNTER — Ambulatory Visit
Admission: RE | Admit: 2013-12-31 | Discharge: 2013-12-31 | Disposition: A | Discharge status: Home / Self Care | Payer: 59 | Source: Ambulatory Visit | Attending: Orthopaedic Surgery | Admitting: Orthopaedic Surgery

## 2013-12-31 DIAGNOSIS — M25511 Pain in right shoulder: Secondary | ICD-10-CM

## 2015-02-10 ENCOUNTER — Other Ambulatory Visit: Payer: Self-pay | Admitting: Orthopaedic Surgery

## 2015-02-10 DIAGNOSIS — M545 Low back pain: Secondary | ICD-10-CM

## 2015-02-26 ENCOUNTER — Ambulatory Visit
Admission: RE | Admit: 2015-02-26 | Discharge: 2015-02-26 | Disposition: A | Discharge status: Home / Self Care | Payer: 59 | Source: Ambulatory Visit | Attending: Orthopaedic Surgery | Admitting: Orthopaedic Surgery

## 2015-02-26 DIAGNOSIS — M545 Low back pain: Secondary | ICD-10-CM

## 2015-03-09 ENCOUNTER — Other Ambulatory Visit (HOSPITAL_COMMUNITY): Payer: Self-pay | Admitting: Orthopaedic Surgery

## 2015-04-06 NOTE — Patient Instructions (Addendum)
Martin BickersRichard W [name]  04/06/2015   Your procedure is scheduled on:   04/10/2015    [facility]  Entrance [place]  elevators to 3rd floor to  Short Stay Center at    0530 AM.  Call this number if you have problems the morning of surgery [phone]   Remember: ONLY 1 PERSON MAY GO WITH YOU TO SHORT STAY TO GET  READY MORNING OF YOUR SURGERY.  Do not eat food or drink liquids :After Midnight.             Eat a good healthy snack prior to bedtime.    Bring C-pap tubing and mask   Take these medicines the morning of surgery with A SIP OF WATER:   Cymbalta              Take 1/2 of evening dose of Insulin nite before surgery.                No diabetic medications am of surgery.                                 You may not have any metal on your body including hair pins and              piercings  Do not wear jewelry, , lotions, powders or perfumes, deodorant                         Men may shave face and neck.   Do not bring valuables to the hospital. [facility] IS NOT             RESPONSIBLE   FOR VALUABLES.  Contacts, dentures or bridgework may not be worn into surgery.      Patients discharged the day of surgery will not be allowed to drive home.  Name and phone number of your driver:  Special Instructions: coughing and deep breathing exercises, leg exercises               Please read over the following fact sheets you were given: _____________________________________________________________________             [facility] - Preparing [facility] Before surgery, you can play an important role.  Because skin is not sterile, your skin needs to be as free of germs as possible.  You can reduce the number of germs on your skin by washing with CHG (chlorahexidine gluconate) soap before surgery.  CHG is an antiseptic cleaner which kills germs and bonds with the skin to continue killing germs even after washing. Please DO NOT use if you have an allergy  to CHG or antibacterial soaps.  If your skin becomes reddened/irritated stop using the CHG and inform your nurse when you arrive at Short Stay. Do not shave (including legs and underarms) for at least 48 hours prior to the first CHG shower.  You may shave your face/neck. Please follow these instructions carefully:  1.  Shower with CHG Soap the night before surgery and the  morning of Surgery.  2.  If you choose to wash your hair, wash your hair first as usual with your  normal  shampoo.  3.  After you shampoo, rinse your hair and body thoroughly to remove the  shampoo.  4.  Use CHG as you would any other liquid soap.  You can apply chg directly  to the skin and wash                       Gently with a scrungie or clean washcloth.  5.  Apply the CHG Soap to your body ONLY FROM THE NECK DOWN.   Do not use on face/ open                           Wound or open sores. Avoid contact with eyes, ears mouth and genitals (private parts).                       Wash face,  Genitals (private parts) with your normal soap.             6.  Wash thoroughly, paying special attention to the area where your surgery  will be performed.  7.  Thoroughly rinse your body with warm water from the neck down.  8.  DO NOT shower/wash with your normal soap after using and rinsing off  the CHG Soap.                9.  Pat yourself dry with a clean towel.            10.  Wear clean pajamas.            11.  Place clean sheets on your bed the night of your first shower and do not  sleep with pets. Day of Surgery : Do not apply any lotions/deodorants the morning of surgery.  Please wear clean clothes to the hospital/surgery center.  FAILURE TO FOLLOW THESE INSTRUCTIONS MAY RESULT IN THE CANCELLATION OF YOUR SURGERY PATIENT SIGNATURE_________________________________  NURSE SIGNATURE__________________________________  ________________________________________________________________________

## 2015-04-07 ENCOUNTER — Encounter (HOSPITAL_COMMUNITY): Payer: Self-pay

## 2015-04-07 ENCOUNTER — Encounter (HOSPITAL_COMMUNITY)
Admission: RE | Admit: 2015-04-07 | Discharge: 2015-04-07 | Disposition: A | Discharge status: Home / Self Care | Payer: Medicare Other | Source: Ambulatory Visit | Attending: Orthopaedic Surgery | Admitting: Orthopaedic Surgery

## 2015-04-07 DIAGNOSIS — L03115 Cellulitis of right lower limb: Secondary | ICD-10-CM | POA: Diagnosis not present

## 2015-04-07 HISTORY — DX: Sleep apnea, unspecified: G47.30

## 2015-04-07 LAB — CBC
HEMATOCRIT: 42.2 % (ref 39.0–52.0)
HEMOGLOBIN: 13.9 g/dL (ref 13.0–17.0)
MCH: 30.6 pg (ref 26.0–34.0)
MCHC: 32.9 g/dL (ref 30.0–36.0)
MCV: 93 fL (ref 78.0–100.0)
Platelets: 147 10*3/uL — ABNORMAL LOW (ref 150–400)
RBC: 4.54 MIL/uL (ref 4.22–5.81)
RDW: 13 % (ref 11.5–15.5)
WBC: 6.3 10*3/uL (ref 4.0–10.5)

## 2015-04-07 LAB — BASIC METABOLIC PANEL
Anion gap: 8 (ref 5–15)
BUN: 16 mg/dL (ref 6–20)
CALCIUM: 9.2 mg/dL (ref 8.9–10.3)
CHLORIDE: 105 mmol/L (ref 101–111)
CO2: 28 mmol/L (ref 22–32)
Creatinine, Ser: 0.97 mg/dL (ref 0.61–1.24)
GFR calc non Af Amer: >60 mL/min (ref >60)
Glucose, Bld: 196 mg/dL — ABNORMAL HIGH (ref 65–99)
POTASSIUM: 3.9 mmol/L (ref 3.5–5.1)
Sodium: 141 mmol/L (ref 135–145)

## 2015-04-07 NOTE — Progress Notes (Addendum)
09-07-13 LOV- Dr. Vickey Hugerohmeier - [facility] 10-11-11 LOV - Dr. Hart RochesterLawson (vasc. Surgeon) - [facility]

## 2015-04-07 NOTE — Progress Notes (Signed)
04-07-15 - Pt. States Dr. Jacky KindleAronson controls diabetes

## 2015-04-07 NOTE — Progress Notes (Signed)
04-07-15 - Pt. Instructed to bring mask and tubing from c-pap machine

## 2015-04-09 MED ORDER — DEXTROSE 5 % IV SOLN
3.0000 g | INTRAVENOUS | Status: AC
  Administered 2015-04-10: 3 g via INTRAVENOUS
  Filled 2015-04-09 (×2): qty 3000

## 2015-04-09 NOTE — Anesthesia Preprocedure Evaluation (Addendum)
Anesthesia Evaluation  Patient identified by MRN, date of birth, ID band Patient awake    Reviewed: Allergy & Precautions, NPO status , Patient's Chart, lab work & pertinent test results  History of Anesthesia Complications Negative for: history of anesthetic complications  Airway Mallampati: III  TM Distance: >3 FB Neck ROM: Full   Comment: beard Dental no notable dental hx. (+) Dental Advisory Given, Poor Dentition   Pulmonary sleep apnea and Continuous Positive Airway Pressure Ventilation ,  breath sounds clear to auscultation  Pulmonary exam normal       Cardiovascular hypertension, Pt. on medications + Peripheral Vascular Disease Normal cardiovascular examRhythm:Regular Rate:Normal     Neuro/Psych negative neurological ROS  negative psych ROS   GI/Hepatic Neg liver ROS, GERD-  Medicated and Controlled,  Endo/Other  diabetes, Type 2, Insulin Dependent  Renal/GU negative Renal ROS  negative genitourinary   Musculoskeletal  (+) Arthritis -, Osteoarthritis,    Abdominal (+) + obese,   Peds negative pediatric ROS (+)  Hematology negative hematology ROS (+)   Anesthesia Other Findings   Reproductive/Obstetrics negative OB ROS                            Anesthesia Physical Anesthesia Plan  ASA: III  Anesthesia Plan: General   Post-op Pain Management: GA combined w/ Regional for post-op pain   Induction: Intravenous  Airway Management Planned: Oral ETT  Additional Equipment:   Intra-op Plan:   Post-operative Plan: Extubation in OR  Informed Consent: I have reviewed the patients History and Physical, chart, labs and discussed the procedure including the risks, benefits and alternatives for the proposed anesthesia with the patient or authorized representative who has indicated his/her understanding and acceptance.   Dental advisory given  Plan Discussed with: CRNA  Anesthesia  Plan Comments:         Anesthesia Quick Evaluation

## 2015-04-10 ENCOUNTER — Ambulatory Visit (HOSPITAL_COMMUNITY)
Admission: RE | Admit: 2015-04-10 | Discharge: 2015-04-10 | Disposition: A | Discharge status: Home / Self Care | Payer: Medicare Other | Source: Ambulatory Visit | Attending: Orthopaedic Surgery | Admitting: Orthopaedic Surgery

## 2015-04-10 ENCOUNTER — Encounter (HOSPITAL_COMMUNITY)
Admission: RE | Disposition: A | Discharge status: Home / Self Care | Payer: Self-pay | Source: Ambulatory Visit | Attending: Orthopaedic Surgery

## 2015-04-10 ENCOUNTER — Encounter (HOSPITAL_COMMUNITY): Payer: Self-pay

## 2015-04-10 ENCOUNTER — Ambulatory Visit (HOSPITAL_COMMUNITY): Payer: Medicare Other | Admitting: Anesthesiology

## 2015-04-10 DIAGNOSIS — Z86718 Personal history of other venous thrombosis and embolism: Secondary | ICD-10-CM

## 2015-04-10 DIAGNOSIS — I10 Essential (primary) hypertension: Secondary | ICD-10-CM | POA: Diagnosis present

## 2015-04-10 DIAGNOSIS — Z6841 Body Mass Index (BMI) 40.0 and over, adult: Secondary | ICD-10-CM | POA: Insufficient documentation

## 2015-04-10 DIAGNOSIS — E1140 Type 2 diabetes mellitus with diabetic neuropathy, unspecified: Secondary | ICD-10-CM | POA: Diagnosis present

## 2015-04-10 DIAGNOSIS — Z794 Long term (current) use of insulin: Secondary | ICD-10-CM | POA: Insufficient documentation

## 2015-04-10 DIAGNOSIS — M7541 Impingement syndrome of right shoulder: Secondary | ICD-10-CM | POA: Insufficient documentation

## 2015-04-10 DIAGNOSIS — Z7982 Long term (current) use of aspirin: Secondary | ICD-10-CM | POA: Insufficient documentation

## 2015-04-10 DIAGNOSIS — I872 Venous insufficiency (chronic) (peripheral): Secondary | ICD-10-CM | POA: Diagnosis present

## 2015-04-10 DIAGNOSIS — Z9989 Dependence on other enabling machines and devices: Secondary | ICD-10-CM

## 2015-04-10 DIAGNOSIS — M7551 Bursitis of right shoulder: Secondary | ICD-10-CM

## 2015-04-10 DIAGNOSIS — I739 Peripheral vascular disease, unspecified: Secondary | ICD-10-CM

## 2015-04-10 DIAGNOSIS — B349 Viral infection, unspecified: Secondary | ICD-10-CM | POA: Diagnosis present

## 2015-04-10 DIAGNOSIS — M65811 Other synovitis and tenosynovitis, right shoulder: Secondary | ICD-10-CM | POA: Insufficient documentation

## 2015-04-10 DIAGNOSIS — K219 Gastro-esophageal reflux disease without esophagitis: Secondary | ICD-10-CM | POA: Diagnosis present

## 2015-04-10 DIAGNOSIS — G4733 Obstructive sleep apnea (adult) (pediatric): Secondary | ICD-10-CM | POA: Diagnosis present

## 2015-04-10 DIAGNOSIS — Z79899 Other long term (current) drug therapy: Secondary | ICD-10-CM

## 2015-04-10 DIAGNOSIS — G4730 Sleep apnea, unspecified: Secondary | ICD-10-CM

## 2015-04-10 DIAGNOSIS — L03115 Cellulitis of right lower limb: Principal | ICD-10-CM | POA: Diagnosis present

## 2015-04-10 DIAGNOSIS — M7581 Other shoulder lesions, right shoulder: Secondary | ICD-10-CM

## 2015-04-10 DIAGNOSIS — R0781 Pleurodynia: Secondary | ICD-10-CM | POA: Diagnosis present

## 2015-04-10 DIAGNOSIS — F319 Bipolar disorder, unspecified: Secondary | ICD-10-CM | POA: Diagnosis present

## 2015-04-10 DIAGNOSIS — Z833 Family history of diabetes mellitus: Secondary | ICD-10-CM

## 2015-04-10 DIAGNOSIS — M1990 Unspecified osteoarthritis, unspecified site: Secondary | ICD-10-CM

## 2015-04-10 DIAGNOSIS — E119 Type 2 diabetes mellitus without complications: Secondary | ICD-10-CM | POA: Insufficient documentation

## 2015-04-10 DIAGNOSIS — R071 Chest pain on breathing: Secondary | ICD-10-CM | POA: Diagnosis present

## 2015-04-10 DIAGNOSIS — Z8249 Family history of ischemic heart disease and other diseases of the circulatory system: Secondary | ICD-10-CM

## 2015-04-10 DIAGNOSIS — G4736 Sleep related hypoventilation in conditions classified elsewhere: Secondary | ICD-10-CM | POA: Diagnosis present

## 2015-04-10 DIAGNOSIS — E785 Hyperlipidemia, unspecified: Secondary | ICD-10-CM | POA: Diagnosis present

## 2015-04-10 HISTORY — PX: SHOULDER ARTHROSCOPY: SHX128

## 2015-04-10 LAB — GLUCOSE, CAPILLARY
Glucose-Capillary: 104 mg/dL — ABNORMAL HIGH (ref 65–99)
Glucose-Capillary: 139 mg/dL — ABNORMAL HIGH (ref 65–99)
Glucose-Capillary: 170 mg/dL — ABNORMAL HIGH (ref 65–99)

## 2015-04-10 SURGERY — ARTHROSCOPY, SHOULDER
Anesthesia: General | Site: Shoulder | Laterality: Right

## 2015-04-10 MED ORDER — MIDAZOLAM HCL 2 MG/2ML IJ SOLN
INTRAMUSCULAR | Status: AC
  Filled 2015-04-10: qty 2

## 2015-04-10 MED ORDER — FENTANYL CITRATE (PF) 100 MCG/2ML IJ SOLN
INTRAMUSCULAR | Status: DC | PRN
  Administered 2015-04-10: 50 ug via INTRAVENOUS
  Administered 2015-04-10 (×2): 25 ug via INTRAVENOUS

## 2015-04-10 MED ORDER — LABETALOL HCL 5 MG/ML IV SOLN
INTRAVENOUS | Status: DC | PRN
  Administered 2015-04-10: 5 mg via INTRAVENOUS

## 2015-04-10 MED ORDER — PHENYLEPHRINE HCL 10 MG/ML IJ SOLN
INTRAMUSCULAR | Status: DC | PRN
  Administered 2015-04-10 (×2): 80 ug via INTRAVENOUS

## 2015-04-10 MED ORDER — EPHEDRINE SULFATE 50 MG/ML IJ SOLN
INTRAMUSCULAR | Status: AC
  Filled 2015-04-10: qty 1

## 2015-04-10 MED ORDER — ACETAMINOPHEN 10 MG/ML IV SOLN
INTRAVENOUS | Status: AC
  Filled 2015-04-10: qty 100

## 2015-04-10 MED ORDER — ONDANSETRON HCL 4 MG/2ML IJ SOLN
INTRAMUSCULAR | Status: AC
  Filled 2015-04-10: qty 2

## 2015-04-10 MED ORDER — ROPIVACAINE HCL 5 MG/ML IJ SOLN
INTRAMUSCULAR | Status: AC
  Filled 2015-04-10: qty 30

## 2015-04-10 MED ORDER — LIDOCAINE HCL (CARDIAC) 20 MG/ML IV SOLN
INTRAVENOUS | Status: AC
  Filled 2015-04-10: qty 5

## 2015-04-10 MED ORDER — DOXYCYCLINE HYCLATE 100 MG PO TABS: 100.0000 mg | ORAL_TABLET | Freq: Two times a day (BID) | ORAL | Status: DC

## 2015-04-10 MED ORDER — SUCCINYLCHOLINE CHLORIDE 20 MG/ML IJ SOLN
INTRAMUSCULAR | Status: DC | PRN
  Administered 2015-04-10: 160 mg via INTRAVENOUS

## 2015-04-10 MED ORDER — ACETAMINOPHEN 10 MG/ML IV SOLN
INTRAVENOUS | Status: DC | PRN
  Administered 2015-04-10: 1000 mg via INTRAVENOUS

## 2015-04-10 MED ORDER — LIDOCAINE HCL (CARDIAC) 20 MG/ML IV SOLN
INTRAVENOUS | Status: DC | PRN
  Administered 2015-04-10: 100 mg via INTRAVENOUS

## 2015-04-10 MED ORDER — FENTANYL CITRATE (PF) 250 MCG/5ML IJ SOLN
INTRAMUSCULAR | Status: AC
  Filled 2015-04-10: qty 5

## 2015-04-10 MED ORDER — BUPIVACAINE-EPINEPHRINE (PF) 0.25% -1:200000 IJ SOLN
INTRAMUSCULAR | Status: DC | PRN
  Administered 2015-04-10: 20 mL

## 2015-04-10 MED ORDER — LACTATED RINGERS IV SOLN
INTRAVENOUS | Status: DC | PRN
  Administered 2015-04-10: 07:00:00 via INTRAVENOUS

## 2015-04-10 MED ORDER — FENTANYL CITRATE (PF) 100 MCG/2ML IJ SOLN: 25.0000 ug | INTRAMUSCULAR | Status: DC | PRN

## 2015-04-10 MED ORDER — PROPOFOL 10 MG/ML IV BOLUS
INTRAVENOUS | Status: AC
  Filled 2015-04-10: qty 20

## 2015-04-10 MED ORDER — ONDANSETRON HCL 4 MG/2ML IJ SOLN
INTRAMUSCULAR | Status: DC | PRN
  Administered 2015-04-10: 4 mg via INTRAVENOUS

## 2015-04-10 MED ORDER — SODIUM CHLORIDE 0.9 % IJ SOLN
INTRAMUSCULAR | Status: AC
  Filled 2015-04-10: qty 10

## 2015-04-10 MED ORDER — EPINEPHRINE HCL 1 MG/ML IJ SOLN
INTRAMUSCULAR | Status: DC | PRN
  Administered 2015-04-10: 1 mg

## 2015-04-10 MED ORDER — PHENYLEPHRINE HCL 10 MG/ML IJ SOLN
INTRAMUSCULAR | Status: AC
  Filled 2015-04-10: qty 1

## 2015-04-10 MED ORDER — OXYCODONE-ACETAMINOPHEN 5-325 MG PO TABS: 1.0000 | ORAL_TABLET | ORAL | Status: DC | PRN

## 2015-04-10 MED ORDER — ONDANSETRON HCL 4 MG/2ML IJ SOLN: 4.0000 mg | Freq: Once | INTRAMUSCULAR | Status: DC | PRN

## 2015-04-10 MED ORDER — PHENYLEPHRINE HCL 10 MG/ML IJ SOLN
10.0000 mg | INTRAVENOUS | Status: DC | PRN
  Administered 2015-04-10: 40 ug/min via INTRAVENOUS

## 2015-04-10 MED ORDER — ATROPINE SULFATE 0.4 MG/ML IJ SOLN
INTRAMUSCULAR | Status: AC
  Filled 2015-04-10: qty 1

## 2015-04-10 MED ORDER — EPINEPHRINE HCL 1 MG/ML IJ SOLN
INTRAMUSCULAR | Status: AC
Start: 2015-04-10 — End: 2015-04-10
  Filled 2015-04-10: qty 2

## 2015-04-10 MED ORDER — LACTATED RINGERS IV SOLN: INTRAVENOUS | Status: DC

## 2015-04-10 MED ORDER — BUPIVACAINE-EPINEPHRINE (PF) 0.25% -1:200000 IJ SOLN
INTRAMUSCULAR | Status: AC
  Filled 2015-04-10: qty 30

## 2015-04-10 MED ORDER — ROPIVACAINE HCL 5 MG/ML IJ SOLN
INTRAMUSCULAR | Status: DC | PRN
  Administered 2015-04-10: 30 mL via EPIDURAL

## 2015-04-10 MED ORDER — PROPOFOL 10 MG/ML IV BOLUS
INTRAVENOUS | Status: DC | PRN
  Administered 2015-04-10: 200 mg via INTRAVENOUS
  Administered 2015-04-10 (×2): 50 mg via INTRAVENOUS

## 2015-04-10 SURGICAL SUPPLY — 36 items
BAG ZIPLOCK 12X15 (MISCELLANEOUS) IMPLANT
BLADE 4.2CUDA (BLADE) ×3 IMPLANT
BLADE SURG SZ11 CARB STEEL (BLADE) ×3 IMPLANT
BOOTIES KNEE HIGH SLOAN (MISCELLANEOUS) ×6 IMPLANT
BUR VERTEX HOODED 4.5 (BURR) ×3 IMPLANT
DECANTER SPIKE VIAL GLASS SM (MISCELLANEOUS) IMPLANT
DRAPE SHOULDER BEACH CHAIR (DRAPES) ×3 IMPLANT
DRAPE U-SHAPE 47X51 STRL (DRAPES) ×6 IMPLANT
DRSG PAD ABDOMINAL 8X10 ST (GAUZE/BANDAGES/DRESSINGS) ×3 IMPLANT
ELECT REM PT RETURN 9FT ADLT (ELECTROSURGICAL)
ELECTRODE REM PT RTRN 9FT ADLT (ELECTROSURGICAL) IMPLANT
GAUZE SPONGE 4X4 12PLY STRL (GAUZE/BANDAGES/DRESSINGS) ×3 IMPLANT
GAUZE XEROFORM 1X8 LF (GAUZE/BANDAGES/DRESSINGS) ×3 IMPLANT
GLOVE BIO SURGEON STRL SZ7.5 (GLOVE) ×3 IMPLANT
GLOVE BIOGEL PI IND STRL 8 (GLOVE) ×2 IMPLANT
GLOVE BIOGEL PI INDICATOR 8 (GLOVE) ×4
GLOVE ECLIPSE 8.0 STRL XLNG CF (GLOVE) ×3 IMPLANT
GOWN STRL REUS W/TWL XL LVL3 (GOWN DISPOSABLE) ×6 IMPLANT
IMMOBILIZER SHOULDER FOAM XLGE (SOFTGOODS) ×3 IMPLANT
IV LACTATED RINGER IRRG 3000ML (IV SOLUTION) ×4
IV LR IRRIG 3000ML ARTHROMATIC (IV SOLUTION) ×2 IMPLANT
KIT BASIN OR (CUSTOM PROCEDURE TRAY) ×3 IMPLANT
KIT POSITION SHOULDER SCHLEI (MISCELLANEOUS) ×3 IMPLANT
KIT SHOULDER TRACTION (DRAPES) ×3 IMPLANT
MANIFOLD NEPTUNE II (INSTRUMENTS) ×3 IMPLANT
NEEDLE HYPO 22GX1.5 SAFETY (NEEDLE) ×3 IMPLANT
NEEDLE SPNL 18GX3.5 QUINCKE PK (NEEDLE) ×3 IMPLANT
PACK SHOULDER (CUSTOM PROCEDURE TRAY) IMPLANT
POSITIONER SURGICAL ARM (MISCELLANEOUS) IMPLANT
SET ARTHROSCOPY TUBING (MISCELLANEOUS) ×2
SET ARTHROSCOPY TUBING LN (MISCELLANEOUS) ×1 IMPLANT
SLING ARM IMMOBILIZER LRG (SOFTGOODS) IMPLANT
SLING ARM IMMOBILIZER MED (SOFTGOODS) IMPLANT
SUT ETHILON 3 0 PS 1 (SUTURE) ×3 IMPLANT
TOWEL OR 17X26 10 PK STRL BLUE (TOWEL DISPOSABLE) ×3 IMPLANT
WAND 90 DEG TURBOVAC W/CORD (SURGICAL WAND) ×3 IMPLANT

## 2015-04-10 NOTE — Brief Op Note (Signed)
04/10/2015  8:55 AM  PATIENT:  [name]  54 y.o. male  PRE-OPERATIVE DIAGNOSIS:  right shoulder impingement syndrome  POST-OPERATIVE DIAGNOSIS:  right shoulder impingement syndrome  PROCEDURE:  Procedure(s): RIGHT SHOULDER ARTHROSCOPY WITH DEBRIDEMENT, SUBACROMIAL DECOMPRESSION (Right)  SURGEON:  Surgeon(s) and Role:    * Kathryne Hitch, MD - Primary  PHYSICIAN ASSISTANT: Rexene Edison, PA-C  ANESTHESIA:   local, regional and general  EBL:   minimal  BLOOD ADMINISTERED:none  DRAINS: none   LOCAL MEDICATIONS USED:  MARCAINE     SPECIMEN:  No Specimen  DISPOSITION OF SPECIMEN:  N/A  COUNTS:  YES  TOURNIQUET:  * No tourniquets in log *  DICTATION: .Other Dictation: Dictation Number [id number]  PLAN OF CARE: Discharge to home after PACU  PATIENT DISPOSITION:  PACU - hemodynamically stable.   Delay start of Pharmacological VTE agent (>24hrs) due to surgical blood loss or risk of bleeding: not applicable

## 2015-04-10 NOTE — Transfer of Care (Signed)
Immediate Anesthesia Transfer of Care Note  Patient: [name]  Procedure(s) Performed: Procedure(s): RIGHT SHOULDER ARTHROSCOPY WITH DEBRIDEMENT, SUBACROMIAL DECOMPRESSION (Right)  Patient Location: PACU  Anesthesia Type:GA combined with regional for post-op pain  Level of Consciousness:  sedated, patient cooperative and responds to stimulation  Airway & Oxygen Therapy:Patient Spontanous Breathing and Patient connected to face mask oxgen  Post-op Assessment:  Report given to PACU RN and Post -op Vital signs reviewed and stable  Post vital signs:  Reviewed and stable  Last Vitals:  Filed Vitals:   04/10/15 0743  BP:   Pulse: 75  Temp:   Resp: 20    Complications: No apparent anesthesia complications

## 2015-04-10 NOTE — Anesthesia Postprocedure Evaluation (Signed)
  Anesthesia Post-op Note  Patient: [name]  Procedure(s) Performed: Procedure(s) (LRB): RIGHT SHOULDER ARTHROSCOPY WITH DEBRIDEMENT, SUBACROMIAL DECOMPRESSION (Right)  Patient Location: PACU  Anesthesia Type: GA combined with regional for post-op pain  Level of Consciousness: awake and alert   Airway and Oxygen Therapy: Patient Spontanous Breathing  Post-op Pain: mild  Post-op Assessment: Post-op Vital signs reviewed, Patient's Cardiovascular Status Stable, Respiratory Function Stable, Patent Airway and No signs of Nausea or vomiting  Last Vitals:  Filed Vitals:   04/10/15 1000  BP:   Pulse: 69  Temp: 36.7 C  Resp: 17    Post-op Vital Signs: stable   Complications: No apparent anesthesia complications

## 2015-04-10 NOTE — Progress Notes (Signed)
Wife has CPAP Mask and hose

## 2015-04-10 NOTE — Progress Notes (Signed)
AssistedDr.mary Judd with right, ultrasound guided, interscalene  block. Side rails up, monitors on throughout procedure. See vital signs in flow sheet. Tolerated Procedure well.

## 2015-04-10 NOTE — Discharge Instructions (Signed)
Wear your sling today due to your shoulder block. Ice as needed throughout the day due to swelling. Expect bloody drainage. You can come out of your sling as comfort allows, but no heavy lifting and no reaching overhead until further notice. You can remove your dressing tomorrow and shower, getting your incisions wet. Place daily band-aids over your incisions after you shower.General Anesthesia, Care After Refer to this sheet in the next few weeks. These instructions provide you with information on caring for yourself after your procedure. Your health care provider may also give you more specific instructions. Your treatment has been planned according to current medical practices, but problems sometimes occur. Call your health care provider if you have any problems or questions after your procedure. WHAT TO EXPECT AFTER THE PROCEDURE After the procedure, it is typical to experience:  Sleepiness.  Nausea and vomiting. HOME CARE INSTRUCTIONS  For the first 24 hours after general anesthesia:  Have a responsible person with you.  Do not drive a car. If you are alone, do not take public transportation.  Do not drink alcohol.  Do not take medicine that has not been prescribed by your health care provider.  Do not sign important papers or make important decisions.  You may resume a normal diet and activities as directed by your health care provider.  Change bandages (dressings) as directed.  If you have questions or problems that seem related to general anesthesia, call the hospital and ask for the anesthetist or anesthesiologist on call. SEEK MEDICAL CARE IF:  You have nausea and vomiting that continue the day after anesthesia.  You develop a rash. SEEK IMMEDIATE MEDICAL CARE IF:   You have difficulty breathing.  You have chest pain.  You have any allergic problems. Document Released: 12/12/2000 Document Revised: 09/10/2013 Document Reviewed: 03/21/2013 [facility] Patient  Information 2015 [place], [place]. This information is not intended to replace advice given to you by your health care provider. Make sure you discuss any questions you have with your health care provider.

## 2015-04-10 NOTE — H&P (Signed)
[name] is an 54 y.o. male.   Chief Complaint:   Right shoulder pain HPI:   54 yo male well-known to me that I have seen for many years now.  His right shoulder has become quite painful from known impingement.  He has failed multiple injections, rest, ice/heat, time, and therapy.  At this point, he wishes to proceed with a right shoulder arthroscopy with subacromial decompression in an effort to decrease his pain and improve his shoulder function.  He understands fully the risks of bleeding, nerve injury, infection, and anesthesia risks.  Past Medical History  Diagnosis Date  . Diabetes mellitus age 84  . Phlebitis   . Arthritis   . Hyperlipidemia   . Hypertension   . DVT (deep venous thrombosis)   . Plantar fasciitis     left foot  . Venous insufficiency (chronic) (peripheral)   . Sleep-related hypoventilation 09/05/2013  . Sleep apnea     wears c-pap machine    Past Surgical History  Procedure Laterality Date  . Knee surgery      right knee arthroscopy  . Appendectomy    . Shoulder surgery      left shoulder for labral tear  . Nose surgery    . Colonoscopy N/A 09/06/2013    Procedure: COLONOSCOPY;  Surgeon: Petra Kuba, MD;  Location: [facility];  Service: Endoscopy;  Laterality: N/A;    Family History  Problem Relation Age of Onset  . Cancer Mother     ovarian  . Hypertension Mother   . Arthritis Mother   . Diabetes Mother   . Cancer Father     colon   Social History:  reports that he has never smoked. He has never used smokeless tobacco. He reports that he does not drink alcohol or use illicit drugs.  Allergies: No Known Allergies  Medications Prior to Admission  Medication Sig Dispense Refill  . aspirin 325 MG tablet Take 325 mg by mouth daily.    . diphenhydrAMINE (BENADRYL) 25 mg capsule Take 25 mg by mouth every 6 (six) hours as needed for itching.    . DULoxetine (CYMBALTA) 60 MG capsule Take 60 mg by mouth 2 (two) times daily.    Marland Kitchen ibuprofen  (ADVIL,MOTRIN) 200 MG tablet Take 800-1,000 mg by mouth every 6 (six) hours as needed for mild pain or moderate pain.    Marland Kitchen insulin aspart (NOVOLOG) 100 UNIT/ML injection Inject 40-80 Units into the skin 3 (three) times daily before meals. Sliding scale    . insulin glargine (LANTUS) 100 UNIT/ML injection Inject 50 Units into the skin 2 (two) times daily.     Marland Kitchen losartan (COZAAR) 25 MG tablet Take 25 mg by mouth daily.    . Multiple Vitamin (MULTIVITAMIN WITH MINERALS) TABS tablet Take 1 tablet by mouth daily.      Results for orders placed or performed during the hospital encounter of 04/10/15 (from the past 48 hour(s))  Glucose, capillary     Status: Abnormal   Collection Time: 04/10/15  5:40 AM  Result Value Ref Range   Glucose-Capillary 104 (H) 65 - 99 mg/dL   Comment 1 Notify RN    No results found.  Review of Systems  All other systems reviewed and are negative.   Blood pressure 141/73, pulse 84, temperature 98.6 F (37 C), temperature source Oral, resp. rate 18, height 6' (1.829 m), weight 152.409 kg (336 lb), SpO2 96 %. Physical Exam  Constitutional: He is oriented to person,  place, and time. He appears well-developed and well-nourished.  HENT:  Head: Normocephalic and atraumatic.  Eyes: EOM are normal. Pupils are equal, round, and reactive to light.  Neck: Normal range of motion. Neck supple.  Cardiovascular: Normal rate and regular rhythm.   Respiratory: Effort normal and breath sounds normal.  GI: Soft. Bowel sounds are normal.  Musculoskeletal:       Right shoulder: He exhibits decreased range of motion, tenderness, bony tenderness and crepitus.  Neurological: He is alert and oriented to person, place, and time.  Skin: Skin is warm and dry.  Psychiatric: He has a normal mood and affect.     Assessment/Plan Right shoulder with severe impingement syndrome and failure or conservative treatment 1)  To the OR today for a right shoulder arthroscopy with debridement and  subacromial decompression  Aeric Burnham Y 04/10/2015, 6:57 AM

## 2015-04-10 NOTE — Op Note (Signed)
[name], [name] NO.:  [id number]  MEDICAL RECORD NO.:  [id number]  LOCATION:  [facility]                         FACILITY:  [facility]  PHYSICIAN:  Vanita Panda. Magnus Ivan, M.D.DATE OF BIRTH:  [birth year]  DATE OF PROCEDURE:  04/10/2015 DATE OF DISCHARGE:                              OPERATIVE REPORT   PREOPERATIVE DIAGNOSIS:  Right shoulder severe impingement syndrome.  POSTOPERATIVE DIAGNOSIS:  Right shoulder severe impingement syndrome.  PROCEDURE:  Right shoulder arthroscopy with debridement and subacromial decompression.  SURGEON:  Vanita Panda. Magnus Ivan, M.D.  ASSISTANT:  Richardean Canal, P.A.  ANESTHESIA: 1. Regional right shoulder block. 2. General. 3. Local with 0.25% plain Marcaine.  ANTIBIOTICS:  3 g IV Ancef.  ESTIMATED BLOOD LOSS:  Minimal.  COMPLICATIONS:  None.  INDICATIONS:  [name] is a 54 year old gentleman, well known to me.  I have seen him for a long period of time for right shoulder pain and he has failed all modes of conservative treatment.  An MRI was obtained last year that did show rotator cuff tendinosis and evidence of impingement.  After a long time of conservative treatment, but continued pain, he wishes to proceed with an arthroscopic intervention.  He understands the risks of infection, acute blood loss anemia, nerve and vessel injury among the main risks as well as anesthesia risks and he still does wish to proceed.  He does have multiple medical problems including morbid obesity and diabetes as well as history of blood clots.  DESCRIPTION OF PROCEDURE:  After informed consent was obtained, appropriate right shoulder was marked.  Anesthesia was obtained with regional shoulder block.  He was then brought to the operating room, placed supine on the operating table.  General anesthesia was then obtained.  He was then fashioned in a beach chair position with appropriate positioning of the head and neck and padding of the  down on an operative left arm.  His right arm was prepped and draped with DuraPrep and sterile drapes and placed in 10 pounds of in-line skeletal traction using fishing pole traction device in 45 degrees of forward flexion and neutral rotation.  Time-out was called and he was identified as correct patient, correct right shoulder.  I then made a posterolateral arthroscopy portal and entered the glenohumeral joint where I could see there was inflamed tissue throughout the glenohumeral joint, but the undersurface of the rotator cuff, the subscapularis, and the biceps were all intact.  I then made an anterior portal in the rotator interval and placed a soft tissue ablation wand.  There was significant synovitis in the shoulder and so I performed an extensive debridement in the glenohumeral joint.  Next, I entered the subacromial space through the posterior portal, made a separate lateral portal.  We found thickened tissue throughout the subacromial space with bursitis and tendinitis of the rotator cuff, but a type 1 acromion.  There was only 1 single bone spur I could see.  Through the lateral portal, I used a soft tissue ablation wand and cleaned off all the thickened tissue off the rotator cuff and removed all the bursitis.  I then used a high-speed bur to release some of the bone spur  on the acromion and perform just a minimal partial acromioplasty.  I then allowed fluid to lavage through the shoulder and removed all instrumentation.  I closed the portal sites with interrupted nylon suture and placed Xeroform and well-padded sterile dressing over each incision after inserting mixture of local anesthetic.  A sling was placed.  He was awakened, extubated, and taken to the recovery room in stable condition.  All final counts were correct.  There were no complications noted.  Of note, Richardean Canal, P.A., assisted in this entire case and his assistance was crucial for all aspects of this case  given the patient's size.     Vanita Panda. Magnus Ivan, M.D.     CYB/MEDQ  D:  04/10/2015  T:  04/10/2015  Job:  [id number]

## 2015-04-10 NOTE — Anesthesia Procedure Notes (Addendum)
Procedure Name: Intubation Date/Time: 04/10/2015 7:56 AM Performed by: Epimenio Sarin Pre-anesthesia Checklist: Patient identified, Emergency Drugs available, Suction available, Patient being monitored and Timeout performed Patient Re-evaluated:Patient Re-evaluated prior to inductionOxygen Delivery Method: Circle system utilized Preoxygenation: Pre-oxygenation with 100% oxygen Intubation Type: IV induction Ventilation: Mask ventilation without difficulty, Oral airway inserted - appropriate to patient size and Two handed mask ventilation required Laryngoscope Size: Glidescope and 4 Grade View: Grade II Tube type: Oral Tube size: 7.5 mm Number of attempts: 1 Airway Equipment and Method: Video-laryngoscopy and Rigid stylet Placement Confirmation: ETT inserted through vocal cords under direct vision,  positive ETCO2 and breath sounds checked- equal and bilateral Secured at: 23 cm Tube secured with: Tape Dental Injury: Teeth and Oropharynx as per pre-operative assessment  Difficulty Due To: Difficulty was anticipated and Difficult Airway- due to anterior larynx Comments: Obese patient with full beard. Easy mask with oral airway (10) and 2-handed mask. View of epiglottis only with MAC 3. Glidescope 4, Grade 1/2 view, ETT passed easily through cords.   Anesthesia Regional Block:  Interscalene brachial plexus block  Pre-Anesthetic Checklist: ,, timeout performed, Correct Patient, Correct Site, Correct Laterality, Correct Procedure, Correct Position, site marked, Risks and benefits discussed,  Surgical consent,  Pre-op evaluation,  At surgeon's request and post-op pain management  Laterality: Right  Prep: chloraprep       Needles:  Injection technique: Single-shot  Needle Type: Stimiplex          Additional Needles:  Procedures: ultrasound guided (picture in chart) Interscalene brachial plexus block Narrative:  Injection made incrementally with aspirations every 5  mL.  Performed by: Personally  Anesthesiologist: Evalina Tabak, Corrie Dandy  Additional Notes: Patient tolerated the procedure well without complications

## 2015-04-11 ENCOUNTER — Inpatient Hospital Stay (HOSPITAL_COMMUNITY)
Admission: EM | Admit: 2015-04-11 | Discharge: 2015-04-14 | DRG: 988 | Disposition: A | Discharge status: Home / Self Care | Payer: Medicare Other | Attending: Family Medicine | Admitting: Family Medicine

## 2015-04-11 ENCOUNTER — Emergency Department (HOSPITAL_COMMUNITY): Payer: Medicare Other

## 2015-04-11 ENCOUNTER — Encounter (HOSPITAL_COMMUNITY): Payer: Self-pay | Admitting: *Deleted

## 2015-04-11 DIAGNOSIS — G4733 Obstructive sleep apnea (adult) (pediatric): Secondary | ICD-10-CM | POA: Diagnosis present

## 2015-04-11 DIAGNOSIS — I872 Venous insufficiency (chronic) (peripheral): Secondary | ICD-10-CM | POA: Diagnosis present

## 2015-04-11 DIAGNOSIS — F319 Bipolar disorder, unspecified: Secondary | ICD-10-CM | POA: Diagnosis present

## 2015-04-11 DIAGNOSIS — Z6841 Body Mass Index (BMI) 40.0 and over, adult: Secondary | ICD-10-CM | POA: Diagnosis not present

## 2015-04-11 DIAGNOSIS — E119 Type 2 diabetes mellitus without complications: Secondary | ICD-10-CM

## 2015-04-11 DIAGNOSIS — G4736 Sleep related hypoventilation in conditions classified elsewhere: Secondary | ICD-10-CM | POA: Diagnosis present

## 2015-04-11 DIAGNOSIS — I10 Essential (primary) hypertension: Secondary | ICD-10-CM | POA: Diagnosis present

## 2015-04-11 DIAGNOSIS — Z833 Family history of diabetes mellitus: Secondary | ICD-10-CM | POA: Diagnosis not present

## 2015-04-11 DIAGNOSIS — R071 Chest pain on breathing: Secondary | ICD-10-CM | POA: Diagnosis present

## 2015-04-11 DIAGNOSIS — K219 Gastro-esophageal reflux disease without esophagitis: Secondary | ICD-10-CM | POA: Diagnosis present

## 2015-04-11 DIAGNOSIS — Z86718 Personal history of other venous thrombosis and embolism: Secondary | ICD-10-CM | POA: Diagnosis not present

## 2015-04-11 DIAGNOSIS — Z7982 Long term (current) use of aspirin: Secondary | ICD-10-CM | POA: Diagnosis not present

## 2015-04-11 DIAGNOSIS — IMO0002 Reserved for concepts with insufficient information to code with codable children: Secondary | ICD-10-CM | POA: Diagnosis present

## 2015-04-11 DIAGNOSIS — M7541 Impingement syndrome of right shoulder: Secondary | ICD-10-CM | POA: Diagnosis present

## 2015-04-11 DIAGNOSIS — R0781 Pleurodynia: Secondary | ICD-10-CM | POA: Diagnosis present

## 2015-04-11 DIAGNOSIS — B349 Viral infection, unspecified: Secondary | ICD-10-CM | POA: Diagnosis present

## 2015-04-11 DIAGNOSIS — E785 Hyperlipidemia, unspecified: Secondary | ICD-10-CM | POA: Diagnosis present

## 2015-04-11 DIAGNOSIS — L03115 Cellulitis of right lower limb: Secondary | ICD-10-CM | POA: Diagnosis not present

## 2015-04-11 DIAGNOSIS — Z79899 Other long term (current) drug therapy: Secondary | ICD-10-CM | POA: Diagnosis not present

## 2015-04-11 DIAGNOSIS — Z8249 Family history of ischemic heart disease and other diseases of the circulatory system: Secondary | ICD-10-CM | POA: Diagnosis not present

## 2015-04-11 DIAGNOSIS — E1140 Type 2 diabetes mellitus with diabetic neuropathy, unspecified: Secondary | ICD-10-CM | POA: Diagnosis present

## 2015-04-11 DIAGNOSIS — L0390 Cellulitis, unspecified: Secondary | ICD-10-CM | POA: Insufficient documentation

## 2015-04-11 DIAGNOSIS — R079 Chest pain, unspecified: Secondary | ICD-10-CM

## 2015-04-11 LAB — TROPONIN I
Troponin I: <0.03 ng/mL (ref <0.031)
Troponin I: <0.03 ng/mL (ref <0.031)
Troponin I: <0.03 ng/mL (ref <0.031)

## 2015-04-11 LAB — URINALYSIS, ROUTINE W REFLEX MICROSCOPIC
GLUCOSE, UA: NEGATIVE mg/dL
HGB URINE DIPSTICK: NEGATIVE
Ketones, ur: NEGATIVE mg/dL
Leukocytes, UA: NEGATIVE
Nitrite: NEGATIVE
PROTEIN: NEGATIVE mg/dL
Specific Gravity, Urine: >1.046 — ABNORMAL HIGH (ref 1.005–1.030)
Urobilinogen, UA: 1 mg/dL (ref 0.0–1.0)
pH: 6 (ref 5.0–8.0)

## 2015-04-11 LAB — CBC
HCT: 42.8 % (ref 39.0–52.0)
HCT: 39.5 % (ref 39.0–52.0)
Hemoglobin: 14.4 g/dL (ref 13.0–17.0)
Hemoglobin: 13.2 g/dL (ref 13.0–17.0)
MCH: 31.3 pg (ref 26.0–34.0)
MCH: 31.4 pg (ref 26.0–34.0)
MCHC: 33.6 g/dL (ref 30.0–36.0)
MCHC: 33.4 g/dL (ref 30.0–36.0)
MCV: 93 fL (ref 78.0–100.0)
MCV: 93.8 fL (ref 78.0–100.0)
PLATELETS: 140 10*3/uL — AB (ref 150–400)
Platelets: 143 10*3/uL — ABNORMAL LOW (ref 150–400)
RBC: 4.6 MIL/uL (ref 4.22–5.81)
RBC: 4.21 MIL/uL — AB (ref 4.22–5.81)
RDW: 13 % (ref 11.5–15.5)
RDW: 13.1 % (ref 11.5–15.5)
WBC: 7.9 10*3/uL (ref 4.0–10.5)
WBC: 6.3 10*3/uL (ref 4.0–10.5)

## 2015-04-11 LAB — CK: CK TOTAL: 442 U/L — AB (ref 49–397)

## 2015-04-11 LAB — BASIC METABOLIC PANEL
ANION GAP: 7 (ref 5–15)
BUN: 15 mg/dL (ref 6–20)
CALCIUM: 8.6 mg/dL — AB (ref 8.9–10.3)
CO2: 27 mmol/L (ref 22–32)
Chloride: 104 mmol/L (ref 101–111)
Creatinine, Ser: 1.01 mg/dL (ref 0.61–1.24)
Glucose, Bld: 196 mg/dL — ABNORMAL HIGH (ref 65–99)
Potassium: 4.1 mmol/L (ref 3.5–5.1)
SODIUM: 138 mmol/L (ref 135–145)

## 2015-04-11 LAB — D-DIMER, QUANTITATIVE (NOT AT ARMC): D DIMER QUANT: 0.71 ug{FEU}/mL — AB (ref 0.00–0.48)

## 2015-04-11 LAB — I-STAT TROPONIN, ED: Troponin i, poc: 0.01 ng/mL (ref 0.00–0.08)

## 2015-04-11 LAB — CREATININE, SERUM
Creatinine, Ser: 0.9 mg/dL (ref 0.61–1.24)
GFR calc Af Amer: >60 mL/min (ref >60)
GFR calc non Af Amer: >60 mL/min (ref >60)

## 2015-04-11 LAB — GLUCOSE, CAPILLARY: Glucose-Capillary: 235 mg/dL — ABNORMAL HIGH (ref 65–99)

## 2015-04-11 MED ORDER — ACETAMINOPHEN 650 MG RE SUPP: 650.0000 mg | Freq: Four times a day (QID) | RECTAL | Status: DC | PRN

## 2015-04-11 MED ORDER — ASPIRIN 325 MG PO TABS
325.0000 mg | ORAL_TABLET | Freq: Every day | ORAL | Status: DC
  Administered 2015-04-11 – 2015-04-14 (×4): 325 mg via ORAL
  Filled 2015-04-11 (×4): qty 1

## 2015-04-11 MED ORDER — DIPHENHYDRAMINE HCL 25 MG PO CAPS: 25.0000 mg | ORAL_CAPSULE | Freq: Four times a day (QID) | ORAL | Status: DC | PRN

## 2015-04-11 MED ORDER — ASPIRIN 81 MG PO CHEW
324.0000 mg | CHEWABLE_TABLET | Freq: Once | ORAL | Status: AC
  Administered 2015-04-11: 324 mg via ORAL
  Filled 2015-04-11: qty 4

## 2015-04-11 MED ORDER — ADULT MULTIVITAMIN W/MINERALS CH
1.0000 | ORAL_TABLET | Freq: Every day | ORAL | Status: DC
  Administered 2015-04-11 – 2015-04-14 (×4): 1 via ORAL
  Filled 2015-04-11 (×4): qty 1

## 2015-04-11 MED ORDER — ENOXAPARIN SODIUM 40 MG/0.4ML ~~LOC~~ SOLN
40.0000 mg | SUBCUTANEOUS | Status: DC
  Filled 2015-04-11: qty 0.4

## 2015-04-11 MED ORDER — DULOXETINE HCL 60 MG PO CPEP
60.0000 mg | ORAL_CAPSULE | Freq: Two times a day (BID) | ORAL | Status: DC
  Administered 2015-04-11 – 2015-04-14 (×6): 60 mg via ORAL
  Filled 2015-04-11 (×6): qty 1

## 2015-04-11 MED ORDER — ACETAMINOPHEN 325 MG PO TABS: 650.0000 mg | ORAL_TABLET | Freq: Four times a day (QID) | ORAL | Status: DC | PRN

## 2015-04-11 MED ORDER — INSULIN GLARGINE 100 UNIT/ML ~~LOC~~ SOLN
50.0000 [IU] | Freq: Two times a day (BID) | SUBCUTANEOUS | Status: DC
  Administered 2015-04-11 – 2015-04-13 (×4): 50 [IU] via SUBCUTANEOUS
  Filled 2015-04-11 (×4): qty 0.5

## 2015-04-11 MED ORDER — ENOXAPARIN SODIUM 80 MG/0.8ML ~~LOC~~ SOLN
75.0000 mg | SUBCUTANEOUS | Status: DC
  Administered 2015-04-11 – 2015-04-13 (×3): 75 mg via SUBCUTANEOUS
  Filled 2015-04-11 (×3): qty 0.8

## 2015-04-11 MED ORDER — MORPHINE SULFATE 2 MG/ML IJ SOLN
2.0000 mg | INTRAMUSCULAR | Status: DC | PRN
Start: 2015-04-11 — End: 2015-04-14
  Administered 2015-04-11 (×2): 2 mg via INTRAVENOUS
  Filled 2015-04-11 (×2): qty 1

## 2015-04-11 MED ORDER — INSULIN ASPART 100 UNIT/ML ~~LOC~~ SOLN
0.0000 [IU] | Freq: Three times a day (TID) | SUBCUTANEOUS | Status: DC
  Administered 2015-04-12: 5 [IU] via SUBCUTANEOUS
  Administered 2015-04-12 – 2015-04-13 (×3): 3 [IU] via SUBCUTANEOUS
  Administered 2015-04-13: 8 [IU] via SUBCUTANEOUS
  Administered 2015-04-13 – 2015-04-14 (×2): 5 [IU] via SUBCUTANEOUS

## 2015-04-11 MED ORDER — POLYETHYLENE GLYCOL 3350 17 G PO PACK
17.0000 g | PACK | Freq: Every day | ORAL | Status: DC | PRN
  Administered 2015-04-13 – 2015-04-14 (×2): 17 g via ORAL
  Filled 2015-04-11 (×2): qty 1

## 2015-04-11 MED ORDER — KETOROLAC TROMETHAMINE 30 MG/ML IJ SOLN
30.0000 mg | Freq: Once | INTRAMUSCULAR | Status: AC
  Administered 2015-04-11: 30 mg via INTRAVENOUS
  Filled 2015-04-11: qty 1

## 2015-04-11 MED ORDER — INSULIN ASPART 100 UNIT/ML ~~LOC~~ SOLN
0.0000 [IU] | Freq: Every day | SUBCUTANEOUS | Status: DC
  Administered 2015-04-11: 2 [IU] via SUBCUTANEOUS
  Administered 2015-04-12 – 2015-04-13 (×2): 3 [IU] via SUBCUTANEOUS

## 2015-04-11 MED ORDER — ALUM & MAG HYDROXIDE-SIMETH 200-200-20 MG/5ML PO SUSP: 30.0000 mL | Freq: Four times a day (QID) | ORAL | Status: DC | PRN

## 2015-04-11 MED ORDER — ONDANSETRON HCL 4 MG/2ML IJ SOLN: 4.0000 mg | Freq: Four times a day (QID) | INTRAMUSCULAR | Status: DC | PRN

## 2015-04-11 MED ORDER — ONDANSETRON HCL 4 MG PO TABS: 4.0000 mg | ORAL_TABLET | Freq: Four times a day (QID) | ORAL | Status: DC | PRN

## 2015-04-11 MED ORDER — SODIUM CHLORIDE 0.9 % IV BOLUS (SEPSIS)
1000.0000 mL | Freq: Once | INTRAVENOUS | Status: AC
  Administered 2015-04-11: 1000 mL via INTRAVENOUS

## 2015-04-11 MED ORDER — LOSARTAN POTASSIUM 25 MG PO TABS
25.0000 mg | ORAL_TABLET | Freq: Every day | ORAL | Status: DC
  Administered 2015-04-11 – 2015-04-14 (×4): 25 mg via ORAL
  Filled 2015-04-11 (×4): qty 1

## 2015-04-11 MED ORDER — ZOLPIDEM TARTRATE 5 MG PO TABS: 5.0000 mg | ORAL_TABLET | Freq: Every evening | ORAL | Status: DC | PRN

## 2015-04-11 MED ORDER — GUAIFENESIN ER 600 MG PO TB12
1200.0000 mg | ORAL_TABLET | Freq: Two times a day (BID) | ORAL | Status: DC
Start: 2015-04-11 — End: 2015-04-14
  Administered 2015-04-11 – 2015-04-14 (×6): 1200 mg via ORAL
  Filled 2015-04-11 (×7): qty 2

## 2015-04-11 MED ORDER — HYDROCODONE-ACETAMINOPHEN 5-325 MG PO TABS
1.0000 | ORAL_TABLET | ORAL | Status: DC | PRN
  Administered 2015-04-12 (×3): 1 via ORAL
  Administered 2015-04-13 (×2): 2 via ORAL
  Filled 2015-04-11 (×2): qty 2
  Filled 2015-04-11 (×3): qty 1

## 2015-04-11 MED ORDER — VANCOMYCIN HCL IN DEXTROSE 1-5 GM/200ML-% IV SOLN
1000.0000 mg | Freq: Once | INTRAVENOUS | Status: AC
  Administered 2015-04-11: 1000 mg via INTRAVENOUS
  Filled 2015-04-11: qty 200

## 2015-04-11 MED ORDER — MORPHINE SULFATE 4 MG/ML IJ SOLN
4.0000 mg | Freq: Once | INTRAMUSCULAR | Status: AC
  Administered 2015-04-11: 4 mg via INTRAVENOUS
  Filled 2015-04-11: qty 1

## 2015-04-11 MED ORDER — VANCOMYCIN HCL 10 G IV SOLR
1250.0000 mg | Freq: Two times a day (BID) | INTRAVENOUS | Status: DC
  Administered 2015-04-12 – 2015-04-13 (×3): 1250 mg via INTRAVENOUS
  Filled 2015-04-11 (×3): qty 1250

## 2015-04-11 MED ORDER — POTASSIUM CHLORIDE IN NACL 20-0.9 MEQ/L-% IV SOLN
INTRAVENOUS | Status: DC
  Administered 2015-04-11 – 2015-04-13 (×4): via INTRAVENOUS
  Filled 2015-04-11 (×5): qty 1000

## 2015-04-11 MED ORDER — SODIUM CHLORIDE 0.9 % IJ SOLN
3.0000 mL | Freq: Two times a day (BID) | INTRAMUSCULAR | Status: DC
  Administered 2015-04-11 – 2015-04-13 (×3): 3 mL via INTRAVENOUS

## 2015-04-11 MED ORDER — ALBUTEROL SULFATE (2.5 MG/3ML) 0.083% IN NEBU: 2.5000 mg | INHALATION_SOLUTION | RESPIRATORY_TRACT | Status: DC | PRN

## 2015-04-11 MED ORDER — TETANUS-DIPHTHERIA TOXOIDS TD 5-2 LFU IM INJ
0.5000 mL | INJECTION | Freq: Once | INTRAMUSCULAR | Status: AC
  Administered 2015-04-11: 0.5 mL via INTRAMUSCULAR
  Filled 2015-04-11: qty 0.5

## 2015-04-11 MED ORDER — ONDANSETRON HCL 4 MG/2ML IJ SOLN
4.0000 mg | Freq: Once | INTRAMUSCULAR | Status: AC
  Administered 2015-04-11: 4 mg via INTRAVENOUS
  Filled 2015-04-11: qty 2

## 2015-04-11 MED ORDER — IOHEXOL 350 MG/ML SOLN
100.0000 mL | Freq: Once | INTRAVENOUS | Status: AC | PRN
  Administered 2015-04-11: 100 mL via INTRAVENOUS

## 2015-04-11 MED ORDER — LEVOFLOXACIN IN D5W 750 MG/150ML IV SOLN
750.0000 mg | INTRAVENOUS | Status: DC
  Administered 2015-04-11 – 2015-04-12 (×2): 750 mg via INTRAVENOUS
  Filled 2015-04-11 (×2): qty 150

## 2015-04-11 MED ORDER — MAGNESIUM CITRATE PO SOLN: 1.0000 | Freq: Once | ORAL | Status: AC | PRN

## 2015-04-11 NOTE — H&P (Signed)
Patient Demographics  [name], is a 54 y.o. male  MRN: [id number]   DOB - 1961-08-26  Admit Date - 04/11/2015  Outpatient Primary MD for the patient is [name], MD   Assessment [name] is a 54 year old male with multiple medical problems including DM 2/essential hypertension/GERD/OSA on CPAP/morbid obesity, BMI 55-59.9, impingement syndrome of right shoulder status post Right shoulder arthroscopy with debridement and subacromial decompression on 04/10/2015 by Dr. Jiles Crocker, who now comes in with complains of generalized muscle aches, profuse sweating last night, "feeling beat down" and pleuritic chest pain likely resulting from right leg cellulitis which developed after patient tripped on something 3 days ago. There was concern for acute MI in the ED but I doubt that this is acute coronary syndrome given the generalized nature of his symptoms. However, patient has multiple risk factors for coronary artery disease including morbid obesity/dm2/essential htn. It is possible patient aspirated although he had CTA chest which ruled out pulmonary embolism/consolidation. His white count is normal at 6300, 1st set of cardiac enzymes is negative. EKG shows some nonspecific changes. Patient will be admitted for further workup including (serial cardiac enzymes/2-D echocardiogram-if these are negative, would not pursue further cardiac workup)/UA/urine culture/TSH. Will give aspirin/empiric antibiotics-vancomycin/Levaquin, tetanus shot and IV fluids. Patient at risk for complications of diabetic foot hence tghe need to be aggressive with antibiotics with plans to de-escalate rapidly assuming appropriate response to antibiotic treatment. Plan Cellulitis of leg, right  Admit patient to telemetry  Septic workup including UA/urine culture  Tetanus shot/vancomycin/Levaquin/IV fluids Chest pain on breathing/OSA (obstructive sleep apnea)/Sleep-related hypoventilation  Serial cardiac  enzymes/2-D echocardiogram  Broncho-dilators/oxygen supplementation as needed  CPAP for while sleeping with settings as at home  Incentive spirometry  Aspirin/nitroglycerin as needed DM2 (diabetes mellitus, type 2)/Essential hypertension/GERD/Impingement syndrome of right shoulder/Morbid obesity with BMI of 50.0-59.9, adult  No acute changes  Levemir/SSI/PPI  Follow orthopedics instructions postop  Pain medications DVT/GI Prophylaxis  Lovenox  PPI Family Communication: Admission, patients condition and plan of care including tests being ordered have been discussed with the patient and wife who indicate understanding and agree with the plan and Code Status.  Code Status   Full Code  Likely DC  Home Eventually.  Condition GUARDED    Time spent in minutes : 55  Chief Complaint Chief Complaint  Patient presents with  . Chest Pain     HPI [name]  is a 54 y.o. male who comes in with generalized muscle aches and feeling beats down which started this morning. Patient had right shoulder surgery yesterday and says it went well but he has felt generalized pains including pleuritic chest pain associated with profuse sweating last night. He denies any nausea, vomiting, diarrhea or dysuria. Patient mentions that he tripped on something on Tuesday night and sustained a small cut at the right shin which she is now inflamed. Does not remember when he got his tetanus shot but years ago. Patient was started on doxycycline yesterday. He has otherwise been in relative good health.   Review of Systems   As in the HPI above.   Past Medical History  Diagnosis Date  . Diabetes mellitus age 59  . Phlebitis   . Arthritis   . Hyperlipidemia   . Hypertension   . DVT (deep venous thrombosis)   . Plantar fasciitis     left foot  . Venous insufficiency (chronic) (peripheral)   . Sleep-related hypoventilation 09/05/2013  . Sleep apnea  wears c-pap machine      Past Surgical  History  Procedure Laterality Date  . Knee surgery      right knee arthroscopy  . Appendectomy    . Shoulder surgery      left shoulder for labral tear  . Nose surgery    . Colonoscopy N/A 09/06/2013    Procedure: COLONOSCOPY;  Surgeon: Petra Kuba, MD;  Location: [facility];  Service: Endoscopy;  Laterality: N/A;    Social History History  Substance Use Topics  . Smoking status: Never Smoker   . Smokeless tobacco: Never Used  . Alcohol Use: No    Family History Family History  Problem Relation Age of Onset  . Cancer Mother     ovarian  . Hypertension Mother   . Arthritis Mother   . Diabetes Mother   . Cancer Father     colon    Prior to Admission medications   Medication Sig Start Date End Date Taking? Authorizing Provider  aspirin 325 MG tablet Take 325 mg by mouth daily.   Yes Historical Provider, MD  diphenhydrAMINE (BENADRYL) 25 mg capsule Take 25 mg by mouth every 6 (six) hours as needed for itching.   Yes Historical Provider, MD  doxycycline (VIBRA-TABS) 100 MG tablet Take 1 tablet (100 mg total) by mouth 2 (two) times daily. Patient taking differently: Take 100 mg by mouth 2 (two) times daily. For 10 days 04/10/15  Yes Kathryne Hitch, MD  DULoxetine (CYMBALTA) 60 MG capsule Take 60 mg by mouth 2 (two) times daily.   Yes Historical Provider, MD  ibuprofen (ADVIL,MOTRIN) 200 MG tablet Take 800-1,000 mg by mouth every 6 (six) hours as needed for mild pain or moderate pain.   Yes Historical Provider, MD  insulin aspart (NOVOLOG) 100 UNIT/ML injection Inject 40-80 Units into the skin 3 (three) times daily before meals. Sliding scale   Yes Historical Provider, MD  insulin glargine (LANTUS) 100 UNIT/ML injection Inject 50 Units into the skin 2 (two) times daily.    Yes Historical Provider, MD  losartan (COZAAR) 25 MG tablet Take 25 mg by mouth daily.   Yes Historical Provider, MD  Multiple Vitamin (MULTIVITAMIN WITH MINERALS) TABS tablet Take 1 tablet by mouth  daily.   Yes Historical Provider, MD  oxyCODONE-acetaminophen (ROXICET) 5-325 MG per tablet Take 1-2 tablets by mouth every 4 (four) hours as needed. 04/10/15  Yes Kathryne Hitch, MD    No Known Allergies  Physical Exam  Vitals  Blood pressure 125/69, pulse 76, temperature 98.3 F (36.8 C), temperature source Oral, resp. rate 16, height 6' (1.829 m), weight 151.547 kg (334 lb 1.6 oz), SpO2 97 %.   1. General: lying in bed in NAD.  2. Normal affect and insight, Not Suicidal or Homicidal, Awake Alert, Oriented X 3.  3. No F.N deficits, ALL C.Nerves Intact, Strength 5/5 all 4 extremities, Sensation intact all 4 extremities, Plantars down going.  4. Ears and Eyes appear Normal, Conjunctivae clear, PERRLA. Moist Oral Mucosa.  5. Supple Neck, No JVD, No cervical lymphadenopathy appriciated, No Carotid Bruits. Has right arm sling. Surgical cuts look clean.  6. Symmetrical Chest wall movement, Good air movement bilaterally, CTAB.  7. RRR, No Gallops, Rubs or Murmurs, No Parasternal Heave.  8. Positive Bowel Sounds, Abdomen Soft, Non tender, No organomegaly appriciated,No rebound -guarding or rigidity.  9.  No Cyanosis, Normal Skin Turgor, Cellulitis and wound right leg.Marland Kitchen  10. Good muscle tone,  joints appear normal ,  no effusions, Normal ROM.  11. No Palpable Lymph Nodes in Neck or Axillae  Data Review CBC  Recent Labs Lab 04/07/15 1055 04/11/15 1015  WBC 6.3 7.9  HGB 13.9 14.4  HCT 42.2 42.8  PLT 147* 143*  MCV 93.0 93.0  MCH 30.6 31.3  MCHC 32.9 33.6  RDW 13.0 13.0   ------------------------------------------------------------------------------------------------------------------  Chemistries   Recent Labs Lab 04/07/15 1055 04/11/15 1015  NA 141 138  K 3.9 4.1  CL 105 104  CO2 28 27  GLUCOSE 196* 196*  BUN 16 15  CREATININE 0.97 1.01  CALCIUM 9.2 8.6*    ------------------------------------------------------------------------------------------------------------------ estimated creatinine clearance is 123.8 mL/min (by C-G formula based on Cr of 1.01). ------------------------------------------------------------------------------------------------------------------ No results for input(s): TSH, T4TOTAL, T3FREE, THYROIDAB in the last 72 hours.  Invalid input(s): FREET3   Coagulation profile No results for input(s): INR, PROTIME in the last 168 hours. -------------------------------------------------------------------------------------------------------------------  Recent Labs  04/11/15 1015  DDIMER 0.71*   -------------------------------------------------------------------------------------------------------------------  Cardiac Enzymes  Recent Labs Lab 04/11/15 1015 04/11/15 1336  TROPONINI <0.03 <0.03   ------------------------------------------------------------------------------------------------------------------ Invalid input(s): POCBNP   ---------------------------------------------------------------------------------------------------------------  Urinalysis    Component Value Date/Time   COLORURINE YELLOW 07/09/2008 1028   APPEARANCEUR Clear 07/09/2008 1028   LABSPEC 1.025 07/09/2008 1028   PHURINE 5.5 07/09/2008 1028   GLUCOSEU NEGATIVE 07/09/2008 1028   BILIRUBINUR NEGATIVE 07/09/2008 1028   KETONESUR NEGATIVE 07/09/2008 1028   UROBILINOGEN 0.2 mg/dL 16/06/9603 5409   NITRITE Negative 07/09/2008 1028   LEUKOCYTESUR Negative 07/09/2008 1028    ----------------------------------------------------------------------------------------------------------------  Imaging results  Dg Chest 1 View  04/11/2015   CLINICAL DATA:  Myalgia, history of right shoulder surgery  EXAM: CHEST  1 VIEW  COMPARISON:  CT same date and chest CT 07/01/2013  FINDINGS: Heart size upper limits of normal, likely related to  hypoaeration and AP technique. Lungs are hypoaerated with crowding of the bronchovascular markings. No focal pulmonary opacity. No pleural effusion.  IMPRESSION: No gross evidence for focal acute cardiopulmonary process.   Electronically Signed   By: Christiana Pellant M.D.   On: 04/11/2015 12:09   Ct Angio Chest Pe W/cm &/or Wo Cm  04/11/2015   CLINICAL DATA:  History of recent shoulder surgery with new chest pain  EXAM: CT ANGIOGRAPHY CHEST WITH CONTRAST  TECHNIQUE: Multidetector CT imaging of the chest was performed using the standard protocol during bolus administration of intravenous contrast. Multiplanar CT image reconstructions and MIPs were obtained to evaluate the vascular anatomy.  CONTRAST:  OMNIPAQUE IOHEXOL 350 MG/ML SOLN  COMPARISON:  None.  FINDINGS: The lungs are well aerated bilaterally without focal infiltrate or sizable effusion.  The thoracic inlet is unremarkable. The thoracic aorta and its branches are within normal limits. The pulmonary artery shows a normal branching pattern. No filling defects to suggest pulmonary embolism are identified. No significant hilar or mediastinal adenopathy is seen. Mild coronary calcifications are noted.  The upper abdomen shows no acute abnormality. No acute bony abnormality is noted.  Review of the MIP images confirms the above findings.  IMPRESSION: No evidence of pulmonary emboli.  No acute abnormality is seen.   Electronically Signed   By: Alcide Clever M.D.   On: 04/11/2015 11:34        Kirklin Mcduffee M.D on 04/11/2015 at 6:39 PM  Between 7am to 7pm - Pager - [phone]  After 7pm go to [email or web address] - password [email or web address]  And look for the night coverage person covering me after hours  [facility]  Office  [phone]

## 2015-04-11 NOTE — ED Notes (Signed)
Pt has rt shoulder surgery yesterday, Local block to neck, during night started to have chest pain and muscle pain all over, sweaty and pale on arrival. Reported pt fell last Tues. Injury to rt leg that has abrasions and warm to touch, started on antibiotics yesterday for leg

## 2015-04-11 NOTE — Progress Notes (Signed)
ANTIBIOTIC CONSULT NOTE - INITIAL  Pharmacy Consult for vancomycin Indication: cellulitis  No Known Allergies  Patient Measurements: Height: 6' (182.9 cm) Weight: (!) 334 lb 1.6 oz (151.547 kg) IBW/kg (Calculated) : 77.6  Vital Signs: Temp: 98.3 F (36.8 C) (07/23 1732) Temp Source: Oral (07/23 1732) BP: 125/69 mmHg (07/23 1732) Pulse Rate: 76 (07/23 1732) Intake/Output from previous day:   Intake/Output from this shift:    Labs:  Recent Labs  04/11/15 1015  WBC 7.9  HGB 14.4  PLT 143*  CREATININE 1.01   Estimated Creatinine Clearance: 123.8 mL/min (by C-G formula based on Cr of 1.01). No results for input(s): VANCOTROUGH, VANCOPEAK, VANCORANDOM, GENTTROUGH, GENTPEAK, GENTRANDOM, TOBRATROUGH, TOBRAPEAK, TOBRARND, AMIKACINPEAK, AMIKACINTROU, AMIKACIN in the last 72 hours.   Microbiology: No results found for this or any previous visit (from the past 720 hour(s)).  Medical History: Past Medical History  Diagnosis Date  . Diabetes mellitus age 52  . Phlebitis   . Arthritis   . Hyperlipidemia   . Hypertension   . DVT (deep venous thrombosis)   . Plantar fasciitis     left foot  . Venous insufficiency (chronic) (peripheral)   . Sleep-related hypoventilation 09/05/2013  . Sleep apnea     wears c-pap machine    Medications:  Scheduled:  . aspirin  325 mg Oral Daily  . DULoxetine  60 mg Oral BID  . enoxaparin (LOVENOX) injection  75 mg Subcutaneous Q24H  . guaiFENesin  1,200 mg Oral BID  . [START ON 04/12/2015] insulin aspart  0-15 Units Subcutaneous TID WC  . insulin aspart  0-5 Units Subcutaneous QHS  . insulin glargine  50 Units Subcutaneous BID  . levofloxacin (LEVAQUIN) IV  750 mg Intravenous Q24H  . losartan  25 mg Oral Daily  . multivitamin with minerals  1 tablet Oral Daily  . sodium chloride  3 mL Intravenous Q12H  . tetanus & diphtheria toxoids (adult)  0.5 mL Intramuscular Once   Infusions:  . 0.9 % NaCl with KCl 20 mEq / L      Assessment: 54 yo male presents to ER with CP, muscle aches, SOB, s/p shoulder surgery yesterday. PMH includes DM, arthritis, HLP, HTN, DVT not on anticoagulation any longer, sleep apnea. Patient also found to have area of abrasions and cellulitis on right leg to start vancomycin per pharmacy dosing. WBC WNL, afebrile, SCr 1.01 with CrCl N 83  Goal of Therapy:  Vancomycin trough level 10-15 mcg/ml  Plan:  1) Vancomycin  IV load 2) Vancomycin  IV q12 thereafter   Hessie Knows, PharmD, BCPS Pager [phone] 04/11/2015 6:19 PM

## 2015-04-11 NOTE — Progress Notes (Signed)
Pt brought in his cpap from home with full face mask and prefers to wear it while here in the hospital.  No frays on cord or obvious defects noted.  Machine turns on and appears to be working appropriately.  Sterile water added to humidifier.  Pt states he doesn't need any help with getting it on.  RN aware.

## 2015-04-11 NOTE — Progress Notes (Signed)
Utilization Review completed. Akasia Ahmad RN BSN CM 

## 2015-04-11 NOTE — ED Notes (Signed)
He has just spoken with Dr. Manus Gunning about plan to admit, with which he agrees.

## 2015-04-11 NOTE — ED Provider Notes (Signed)
CSN: [id number]     Arrival date & time 04/11/15  0945 History   First MD Initiated Contact with Patient 04/11/15 1014     Chief Complaint  Patient presents with  . Chest Pain     (Consider location/radiation/quality/duration/timing/severity/associated sxs/prior Treatment) HPI Comments: Patient presents with chest tightness, muscle aches all over, shortness of breath that started around 2 AM. He states he had shoulder surgery yesterday by Dr. Magnus Ivan. Once the block wore off around 2 AM his symptoms started. He states the shoulder itself is not hurting. He has constant chest tightness that does not radiate associated with shortness of breath. Endorses diffuse body aches and feels pale and sweaty. He denies any cardiac history. Never had a heart attack. He has a history of a previous DVT is not anticoagulated. He is a diabetic with high blood pressure. He also has an area of abrasions and cellulitis his right leg. He took one dose of doxycycline yesterday for this. Patient states he had some diaphoretic diaphoresis overnight which is not unusual for him. He feels nauseated and pale on his chest feels tight. The pain is constant. No fever.  The history is provided by the patient.    Past Medical History  Diagnosis Date  . Diabetes mellitus age 54  . Phlebitis   . Arthritis   . Hyperlipidemia   . Hypertension   . DVT (deep venous thrombosis)   . Plantar fasciitis     left foot  . Venous insufficiency (chronic) (peripheral)   . Sleep-related hypoventilation 09/05/2013  . Sleep apnea     wears c-pap machine   Past Surgical History  Procedure Laterality Date  . Knee surgery      right knee arthroscopy  . Appendectomy    . Shoulder surgery      left shoulder for labral tear  . Nose surgery    . Colonoscopy N/A 09/06/2013    Procedure: COLONOSCOPY;  Surgeon: Petra Kuba, MD;  Location: [facility];  Service: Endoscopy;  Laterality: N/A;   Family History  Problem Relation Age of  Onset  . Cancer Mother     ovarian  . Hypertension Mother   . Arthritis Mother   . Diabetes Mother   . Cancer Father     colon   History  Substance Use Topics  . Smoking status: Never Smoker   . Smokeless tobacco: Never Used  . Alcohol Use: No    Review of Systems  Constitutional: Positive for chills, diaphoresis and fatigue. Negative for fever, activity change and appetite change.  HENT: Negative for congestion and rhinorrhea.   Respiratory: Positive for chest tightness and shortness of breath.   Cardiovascular: Positive for chest pain and leg swelling.  Gastrointestinal: Positive for nausea. Negative for abdominal pain.  Genitourinary: Negative for dysuria, urgency, hematuria and testicular pain.  Musculoskeletal: Negative for myalgias and arthralgias.  Skin: Positive for rash.  Neurological: Positive for weakness. Negative for dizziness, light-headedness and headaches.  A complete 10 system review of systems was obtained and all systems are negative except as noted in the HPI and PMH.      Allergies  Review of patient's allergies indicates no known allergies.  Home Medications   Prior to Admission medications   Medication Sig Start Date End Date Taking? Authorizing Provider  aspirin 325 MG tablet Take 325 mg by mouth daily.   Yes Historical Provider, MD  diphenhydrAMINE (BENADRYL) 25 mg capsule Take 25 mg by mouth every 6 (six) hours as  needed for itching.   Yes Historical Provider, MD  doxycycline (VIBRA-TABS) 100 MG tablet Take 1 tablet (100 mg total) by mouth 2 (two) times daily. Patient taking differently: Take 100 mg by mouth 2 (two) times daily. For 10 days 04/10/15  Yes Kathryne Hitch, MD  DULoxetine (CYMBALTA) 60 MG capsule Take 60 mg by mouth 2 (two) times daily.   Yes Historical Provider, MD  ibuprofen (ADVIL,MOTRIN) 200 MG tablet Take 800-1,000 mg by mouth every 6 (six) hours as needed for mild pain or moderate pain.   Yes Historical Provider, MD    insulin aspart (NOVOLOG) 100 UNIT/ML injection Inject 40-80 Units into the skin 3 (three) times daily before meals. Sliding scale   Yes Historical Provider, MD  insulin glargine (LANTUS) 100 UNIT/ML injection Inject 50 Units into the skin 2 (two) times daily.    Yes Historical Provider, MD  losartan (COZAAR) 25 MG tablet Take 25 mg by mouth daily.   Yes Historical Provider, MD  Multiple Vitamin (MULTIVITAMIN WITH MINERALS) TABS tablet Take 1 tablet by mouth daily.   Yes Historical Provider, MD  oxyCODONE-acetaminophen (ROXICET) 5-325 MG per tablet Take 1-2 tablets by mouth every 4 (four) hours as needed. 04/10/15  Yes Kathryne Hitch, MD   BP 111/57 mmHg  Pulse 73  Temp(Src) 98.8 F (37.1 C) (Oral)  Resp 16  SpO2 96% Physical Exam  Constitutional: He is oriented to person, place, and time. He appears well-developed and well-nourished. No distress.  HENT:  Head: Normocephalic and atraumatic.  Mouth/Throat: Oropharynx is clear and moist. No oropharyngeal exudate.  Eyes: Conjunctivae and EOM are normal. Pupils are equal, round, and reactive to light.  Neck: Normal range of motion. Neck supple.  No meningismus.  Cardiovascular: Normal rate, regular rhythm, normal heart sounds and intact distal pulses.   No murmur heard. Pulmonary/Chest: Effort normal and breath sounds normal. No respiratory distress. He exhibits no tenderness.  Decreased breath sounds.  Abdominal: Soft. There is no tenderness. There is no rebound and no guarding.  Musculoskeletal: Normal range of motion. He exhibits tenderness. He exhibits no edema.  R shoulder dressing in place.  Erythema and abrasion with some drainage. Intact DP and PT pulses Equal radial pulses  Neurological: He is alert and oriented to person, place, and time. No cranial nerve deficit. He exhibits normal muscle tone. Coordination normal.  No ataxia on finger to nose bilaterally. No pronator drift. 5/5 strength throughout. CN 2-12 intact.  Negative Romberg. Equal grip strength. Sensation intact. Gait is normal.   Skin: Skin is warm.  Psychiatric: He has a normal mood and affect. His behavior is normal.  Nursing note and vitals reviewed.     ED Course  Procedures (including critical care time) Labs Review Labs Reviewed  BASIC METABOLIC PANEL - Abnormal; Notable for the following:    Glucose, Bld 196 (*)    Calcium 8.6 (*)    All other components within normal limits  CBC - Abnormal; Notable for the following:    Platelets 143 (*)    All other components within normal limits  D-DIMER, QUANTITATIVE (NOT AT [facility]) - Abnormal; Notable for the following:    D-Dimer, Quant 0.71 (*)    All other components within normal limits  CK - Abnormal; Notable for the following:    Total CK 442 (*)    All other components within normal limits  TROPONIN I  TROPONIN I  I-STAT TROPOININ, ED    Imaging Review Dg Chest 1 View  04/11/2015   CLINICAL DATA:  Myalgia, history of right shoulder surgery  EXAM: CHEST  1 VIEW  COMPARISON:  CT same date and chest CT 07/01/2013  FINDINGS: Heart size upper limits of normal, likely related to hypoaeration and AP technique. Lungs are hypoaerated with crowding of the bronchovascular markings. No focal pulmonary opacity. No pleural effusion.  IMPRESSION: No gross evidence for focal acute cardiopulmonary process.   Electronically Signed   By: Christiana Pellant M.D.   On: 04/11/2015 12:09   Ct Angio Chest Pe W/cm &/or Wo Cm  04/11/2015   CLINICAL DATA:  History of recent shoulder surgery with new chest pain  EXAM: CT ANGIOGRAPHY CHEST WITH CONTRAST  TECHNIQUE: Multidetector CT imaging of the chest was performed using the standard protocol during bolus administration of intravenous contrast. Multiplanar CT image reconstructions and MIPs were obtained to evaluate the vascular anatomy.  CONTRAST:  OMNIPAQUE IOHEXOL 350 MG/ML SOLN  COMPARISON:  None.  FINDINGS: The lungs are well aerated bilaterally without  focal infiltrate or sizable effusion.  The thoracic inlet is unremarkable. The thoracic aorta and its branches are within normal limits. The pulmonary artery shows a normal branching pattern. No filling defects to suggest pulmonary embolism are identified. No significant hilar or mediastinal adenopathy is seen. Mild coronary calcifications are noted.  The upper abdomen shows no acute abnormality. No acute bony abnormality is noted.  Review of the MIP images confirms the above findings.  IMPRESSION: No evidence of pulmonary emboli.  No acute abnormality is seen.   Electronically Signed   By: Alcide Clever M.D.   On: 04/11/2015 11:34     EKG Interpretation   Date/Time:  Saturday April 11 2015 09:54:53 EDT Ventricular Rate:  79 PR Interval:  148 QRS Duration: 113 QT Interval:  406 QTC Calculation: 465 R Axis:   18 Text Interpretation:  Sinus rhythm Borderline intraventricular conduction  delay Baseline wander in lead(s) V1 No significant change was found  Confirmed by Manus Gunning  MD, Carli Lefevers ([id number]) on 04/11/2015 10:33:23 AM      MDM   Final diagnoses:  Chest pain  Cellulitis of leg without foot, right   Patient underwent right shoulder arthroscopy (RIGHT SHOULDER ARTHROSCOPY WITH DEBRIDEMENT, SUBACROMIAL DECOMPRESSION (Right)) yesterday by Dr. Magnus Ivan. He's reports diffuse body aches with chest tightness, diaphoresis and nausea. Has cellulitis to right leg. No fever.  EKG nonischemic.  Pain seems atypical for ACS and has been constant since 1am.  HEART score 3.  CXR negative. CTPE negative.  CK minimally elevated. Patient with myalgias and chills, possibly from leg cellulitis.   Risk factors for ACS significant but pain atypical.   Will admit for rule out and IV anitbiotics.  D/w Dr. Venetia Constable.    Glynn Octave, MD 04/11/15 1725

## 2015-04-12 ENCOUNTER — Inpatient Hospital Stay (HOSPITAL_COMMUNITY): Payer: Medicare Other

## 2015-04-12 DIAGNOSIS — L03115 Cellulitis of right lower limb: Principal | ICD-10-CM

## 2015-04-12 LAB — GLUCOSE, CAPILLARY
GLUCOSE-CAPILLARY: 170 mg/dL — AB (ref 65–99)
Glucose-Capillary: 202 mg/dL — ABNORMAL HIGH (ref 65–99)
Glucose-Capillary: 199 mg/dL — ABNORMAL HIGH (ref 65–99)
Glucose-Capillary: 296 mg/dL — ABNORMAL HIGH (ref 65–99)

## 2015-04-12 LAB — TROPONIN I: Troponin I: <0.03 ng/mL (ref <0.031)

## 2015-04-12 LAB — TSH: TSH: 0.952 u[IU]/mL (ref 0.350–4.500)

## 2015-04-12 LAB — CBC WITH DIFFERENTIAL/PLATELET
BASOS ABS: 0 10*3/uL (ref 0.0–0.1)
Basophils Relative: 1 % (ref 0–1)
Eosinophils Absolute: 0.2 10*3/uL (ref 0.0–0.7)
Eosinophils Relative: 3 % (ref 0–5)
HCT: 38.3 % — ABNORMAL LOW (ref 39.0–52.0)
Hemoglobin: 13.1 g/dL (ref 13.0–17.0)
LYMPHS PCT: 27 % (ref 12–46)
Lymphs Abs: 1.8 10*3/uL (ref 0.7–4.0)
MCH: 32.3 pg (ref 26.0–34.0)
MCHC: 34.2 g/dL (ref 30.0–36.0)
MCV: 94.3 fL (ref 78.0–100.0)
MONOS PCT: 8 % (ref 3–12)
Monocytes Absolute: 0.5 10*3/uL (ref 0.1–1.0)
Neutro Abs: 4 10*3/uL (ref 1.7–7.7)
Neutrophils Relative %: 61 % (ref 43–77)
PLATELETS: 141 10*3/uL — AB (ref 150–400)
RBC: 4.06 MIL/uL — ABNORMAL LOW (ref 4.22–5.81)
RDW: 13.2 % (ref 11.5–15.5)
WBC: 6.5 10*3/uL (ref 4.0–10.5)

## 2015-04-12 LAB — PROTIME-INR
INR: 1.05 (ref 0.00–1.49)
Prothrombin Time: 13.9 seconds (ref 11.6–15.2)

## 2015-04-12 LAB — MAGNESIUM: Magnesium: 1.8 mg/dL (ref 1.7–2.4)

## 2015-04-12 LAB — PHOSPHORUS: Phosphorus: 3.1 mg/dL (ref 2.5–4.6)

## 2015-04-12 MED ORDER — ENOXAPARIN SODIUM 30 MG/0.3ML ~~LOC~~ SOLN: 30.0000 mg | Freq: Two times a day (BID) | SUBCUTANEOUS | Status: DC

## 2015-04-12 NOTE — Progress Notes (Signed)
Offered to assist pt with home cpap, but he declined.  Pt stated he doesn't need any help with it.  RT will monitor and assist as needed.

## 2015-04-12 NOTE — Progress Notes (Signed)
[name] NGE:[id number] DOB: 06/25/1961 DOA: 04/11/2015 PCP: [name], MD  Brief narrative: 54 y/o ? ty II DM, HTn, OSA on Cpap, Morbid obesity, Colonoscopy 2014, Lumbar DDD, piror RLE DVT 2012 s/p Arthroscopy Body mass index is 46.13 kg/(m^2).-recent R shoulder surgery 7/22.  Past medical history-As per Problem list Chart reviewed as below-   Consultants:  None yet  Procedures:    Antibiotics:  Cafazolin  LEvaquin  Vancomycin   Subjective   Looks and feels well Absolutely no chest pain other than when he coughs No nausea vomiting States he has myalgias bilaterally lower extremities and in the hips going down the Lidex Some pain when walking which decreases bilaterally when he doesn't walk   Objective    Interim History:   Telemetry:    Objective: Filed Vitals:   04/11/15 1630 04/11/15 1732 04/11/15 2009 04/12/15 0425  BP: 111/57 125/69 128/64 160/72  Pulse: 73 76 73 81  Temp: 98.8 F (37.1 C) 98.3 F (36.8 C) 98.2 F (36.8 C)   TempSrc: Oral Oral Oral   Resp: Height:  6' (1.829 m)    Weight:  151.547 kg (334 lb 1.6 oz)  154.3 kg (340 lb 2.7 oz)  SpO2: 96% 97% 96% 98%    Intake/Output Summary (Last 24 hours) at 04/12/15 1102 Last data filed at 04/12/15 0858  Gross per 24 hour  Intake   2685 ml  Output    300 ml  Net   2385 ml    Exam:  EOMI NCAT Morbidly obese Thick neck Chest clear Surgical scar well-healed Lower extremities not swollen not painful no cord noted up to thigh There is some erythema as well as a bruise on the front of the shin  Data Reviewed: Basic Metabolic Panel:  Recent Labs Lab 04/07/15 1055 04/11/15 1015 04/11/15 1905 04/12/15 0117  NA 141 138  --   --   K 3.9 4.1  --   --   CL 105 104  --   --   CO2 28 27  --   --   GLUCOSE 196* 196*  --   --   BUN 16 15  --   --   CREATININE 0.97 1.01 0.90  --   CALCIUM 9.2 8.6*  --   --   MG  --   --   --  1.8  PHOS  --   --   --  3.1    Liver Function Tests: No results for input(s): AST, ALT, ALKPHOS, BILITOT, PROT, ALBUMIN in the last 168 hours. No results for input(s): LIPASE, AMYLASE in the last 168 hours. No results for input(s): AMMONIA in the last 168 hours. CBC:  Recent Labs Lab 04/07/15 1055 04/11/15 1015 04/11/15 1905 04/12/15 0117  WBC 6.3 7.9 6.3 6.5  NEUTROABS  --   --   --  4.0  HGB 13.9 14.4 13.2 13.1  HCT 42.2 42.8 39.5 38.3*  MCV 93.0 93.0 93.8 94.3  PLT 147* 143* 140* 141*   Cardiac Enzymes:  Recent Labs Lab 04/11/15 1015 04/11/15 1336 04/11/15 1905 04/12/15 0117  CKTOTAL 442*  --   --   --   TROPONINI <0.03 <0.03 <0.03 <0.03   BNP: Invalid input(s): POCBNP CBG:  Recent Labs Lab 04/10/15 0540 04/10/15 0909 04/10/15 1140 04/11/15 2150 04/12/15 0744  GLUCAP 104* 139* 170* 235* 202*    No results found for this or any previous visit (from the past 240 hour(s)).  Studies:              All Imaging reviewed and is as per above notation   Scheduled Meds: . aspirin  325 mg Oral Daily  . DULoxetine  60 mg Oral BID  . enoxaparin (LOVENOX) injection  75 mg Subcutaneous Q24H  . guaiFENesin  1,200 mg Oral BID  . insulin aspart  0-15 Units Subcutaneous TID WC  . insulin aspart  0-5 Units Subcutaneous QHS  . insulin glargine  50 Units Subcutaneous BID  . levofloxacin (LEVAQUIN) IV  750 mg Intravenous Q24H  . losartan  25 mg Oral Daily  . multivitamin with minerals  1 tablet Oral Daily  . sodium chloride  3 mL Intravenous Q12H  . vancomycin  1,250 mg Intravenous Q12H   Continuous Infusions: . 0.9 % NaCl with KCl 20 mEq / L 100 mL/hr at 04/12/15 2956     Assessment/Plan:  Fever, myalgia and rash-likely secondary to cellulitis of the right lower extremity, DDX = viral syndrome Narrow antibiotics rapidly 7/25 to by mouth doxycycline for MRSA, some Streptococcus coverage  Body mass index is 46.13 kg/(m^2). 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 Magnus Ivan who just did surgery on her to let him know  Hypertension Continue losartan 25 daily  Diabetes mellitus type 2 with neuropathy lantus 50 bid Tid ACS coverage 180-200  Bipolar Continue Duloxetine 60 bid  Code Status: Presumed full Family Communication: discussed with wife Disposition Plan: inpatient   Pleas Koch, MD  [facility] Pager [phone] 04/12/2015, 11:02 AM    LOS: 1 day

## 2015-04-12 NOTE — Progress Notes (Signed)
Patient removed his telemetry box and refuses to wear it. MD was notified that the pt is refusing cardiac monitoring. Will continue to monitor.

## 2015-04-13 ENCOUNTER — Encounter (HOSPITAL_COMMUNITY): Payer: Self-pay | Admitting: Orthopaedic Surgery

## 2015-04-13 LAB — GLUCOSE, CAPILLARY
GLUCOSE-CAPILLARY: 267 mg/dL — AB (ref 65–99)
GLUCOSE-CAPILLARY: 189 mg/dL — AB (ref 65–99)
GLUCOSE-CAPILLARY: 287 mg/dL — AB (ref 65–99)
Glucose-Capillary: 242 mg/dL — ABNORMAL HIGH (ref 65–99)

## 2015-04-13 LAB — HEMOGLOBIN A1C
HEMOGLOBIN A1C: 7.5 % — AB (ref 4.8–5.6)
Mean Plasma Glucose: 169 mg/dL

## 2015-04-13 MED ORDER — INSULIN GLARGINE 100 UNIT/ML ~~LOC~~ SOLN
55.0000 [IU] | Freq: Two times a day (BID) | SUBCUTANEOUS | Status: DC
  Administered 2015-04-13 – 2015-04-14 (×2): 55 [IU] via SUBCUTANEOUS
  Filled 2015-04-13 (×2): qty 0.55

## 2015-04-13 MED ORDER — AMOXICILLIN 500 MG PO CAPS
500.0000 mg | ORAL_CAPSULE | Freq: Three times a day (TID) | ORAL | Status: DC
  Administered 2015-04-13 – 2015-04-14 (×4): 500 mg via ORAL
  Filled 2015-04-13 (×5): qty 1

## 2015-04-13 MED ORDER — DOXYCYCLINE HYCLATE 100 MG PO TABS
100.0000 mg | ORAL_TABLET | Freq: Two times a day (BID) | ORAL | Status: DC
  Administered 2015-04-13 – 2015-04-14 (×3): 100 mg via ORAL
  Filled 2015-04-13 (×3): qty 1

## 2015-04-13 MED ORDER — FLEET ENEMA 7-19 GM/118ML RE ENEM
1.0000 | ENEMA | Freq: Once | RECTAL | Status: DC
  Filled 2015-04-13: qty 1

## 2015-04-13 NOTE — Care Management Note (Signed)
Case Management Note  Patient Details  Name: MAYSON MCNEISH MRN: [id number] Date of Birth: 1960/11/04  Subjective/Objective:54 y/o m admitted w/r leg cellulitis. From home.                    Action/Plan:No anticipated d/c needs.   Expected Discharge Date:                  Expected Discharge Plan:  Home/Self Care  In-House Referral:     Discharge planning Services  CM Consult  Post Acute Care Choice:    Choice offered to:     DME Arranged:    DME Agency:     HH Arranged:    HH Agency:     Status of Service:  In process, will continue to follow  Medicare Important Message Given:    Date Medicare IM Given:    Medicare IM give by:    Date Additional Medicare IM Given:    Additional Medicare Important Message give by:     If discussed at Long Length of Stay Meetings, dates discussed:    Additional Comments:  Lanier Clam, RN 04/13/2015, 4:04 PM

## 2015-04-13 NOTE — Progress Notes (Signed)
[name] YQM:[id number] DOB: 04-20-1961 DOA: 04/11/2015 PCP: [name], MD  Brief narrative: 54 y/o ? 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  Past medical history-As per Problem list Chart reviewed as below-   Consultants:  None yet  Procedures:    Antibiotics:  Cafazolin  LEvaquin  Vancomycin   Subjective   Looks and feels better No rash no mylagias now No cp    Objective    Interim History:   Telemetry:    Objective: Filed Vitals:   04/12/15 0425 04/12/15 1342 04/12/15 2209 04/13/15 0526  BP: 160/72 132/64 147/63 159/80  Pulse: 81 78 81 72  Temp:  98.3 F (36.8 C) 98.2 F (36.8 C) 98.2 F (36.8 C)  TempSrc:  Oral Oral Oral  Resp: Height:      Weight: 154.3 kg (340 lb 2.7 oz)   154.7 kg (341 lb 0.8 oz)  SpO2: 98% 98% 100% 98%    Intake/Output Summary (Last 24 hours) at 04/13/15 1037 Last data filed at 04/13/15 0848  Gross per 24 hour  Intake   3760 ml  Output      0 ml  Net   3760 ml    Exam:  EOMI NCAT Morbidly obese Thick neck Chest clear Surgical scar well-healed Lower extremities not swollen not painful no cord noted up to thigh There is some erythema as well as a bruise on the front of the shin with discolouration as well as redness  Data Reviewed: Basic Metabolic Panel:  Recent Labs Lab 04/07/15 1055 04/11/15 1015 04/11/15 1905 04/12/15 0117  NA 141 138  --   --   K 3.9 4.1  --   --   CL 105 104  --   --   CO2 28 27  --   --   GLUCOSE 196* 196*  --   --   BUN 16 15  --   --   CREATININE 0.97 1.01 0.90  --   CALCIUM 9.2 8.6*  --   --   MG  --   --   --  1.8  PHOS  --   --   --  3.1   Liver Function Tests: No results for input(s): AST, ALT, ALKPHOS, BILITOT, PROT, ALBUMIN in the last 168 hours. No results for input(s): LIPASE, AMYLASE in the last 168  hours. No results for input(s): AMMONIA in the last 168 hours. CBC:  Recent Labs Lab 04/07/15 1055 04/11/15 1015 04/11/15 1905 04/12/15 0117  WBC 6.3 7.9 6.3 6.5  NEUTROABS  --   --   --  4.0  HGB 13.9 14.4 13.2 13.1  HCT 42.2 42.8 39.5 38.3*  MCV 93.0 93.0 93.8 94.3  PLT 147* 143* 140* 141*   Cardiac Enzymes:  Recent Labs Lab 04/11/15 1015 04/11/15 1336 04/11/15 1905 04/12/15 0117  CKTOTAL 442*  --   --   --   TROPONINI <0.03 <0.03 <0.03 <0.03   BNP: Invalid input(s): POCBNP CBG:  Recent Labs Lab 04/12/15 0744 04/12/15 1206 04/12/15 1721 04/12/15 2208 04/13/15 0734  GLUCAP 202* 199* 170* 296* 242*    No results found for this or any previous visit (from the past 240 hour(s)).   Studies:              All Imaging reviewed and is as per above  notation   Scheduled Meds: . aspirin  325 mg Oral Daily  . doxycycline  100 mg Oral Q12H  . DULoxetine  60 mg Oral BID  . enoxaparin (LOVENOX) injection  75 mg Subcutaneous Q24H  . guaiFENesin  1,200 mg Oral BID  . insulin aspart  0-15 Units Subcutaneous TID WC  . insulin aspart  0-5 Units Subcutaneous QHS  . insulin glargine  50 Units Subcutaneous BID  . losartan  25 mg Oral Daily  . multivitamin with minerals  1 tablet Oral Daily  . sodium chloride  3 mL Intravenous Q12H  . sodium phosphate  1 enema Rectal Once   Continuous Infusions:     Assessment/Plan:  Fever, myalgia and rash-likely secondary to cellulitis of the right lower extremity, DDX = viral syndrome Narrowed antibiotics rapidly 7/25 to by mouth doxycycline for MRSA, some Streptococcus coverage  Body mass index is 46.24 kg/(m^2). 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 On lovenox prophylactic now continue ASA 325 daily  Right shoulder repair 04/10/15 I will  cc her orthopedic surgeon Dr. Magnus Ivan who just did surgery on her to let him know  Hypertension Continue losartan 25 daily  Diabetes mellitus type 2 with neuropathy Increase lantus 50 bid-->55 Tid ACS coverage 190-280 Consider changing moderate to resistant scale coverage  Bipolar Continue Duloxetine 60 bid  Code Status: Presumed full Family Communication: discussed with wife Disposition Plan: inpatient   Pleas Koch, MD  [facility] Pager [phone] 04/13/2015, 10:37 AM    LOS: 2 days

## 2015-04-13 NOTE — Progress Notes (Signed)
Inpatient Diabetes Program Recommendations  AACE/ADA: New Consensus Statement on Inpatient Glycemic Control (2013)  Target Ranges:  Prepandial:   less than 140 mg/dL      Peak postprandial:   less than 180 mg/dL (1-2 hours)      Critically ill patients:  140 - 180 mg/dL    Results for EMANI, MORAD (MRN [id number]) as of 04/13/2015 10:42  Ref. Range 04/12/2015 07:44 04/12/2015 12:06 04/12/2015 17:21 04/12/2015 22:08  Glucose-Capillary Latest Ref Range: 65-99 mg/dL 409 (H) 811 (H) 914 (H) 296 (H)   Results for LOLA, LOFARO (MRN [id number]) as of 04/13/2015 10:42  Ref. Range 04/13/2015 07:34  Glucose-Capillary Latest Ref Range: 65-99 mg/dL 086 (H)    Home DM Meds: Lantus 50 units bid       Novolog 40-80 units tid per SSI   Current DM Orders: Lantus 50 units bid            Novolog Moderate SSI (0-15 units) TID AC + HS     -Eating 100% of meals.  -Having both elevated fasting and postprandial glucose levels.     MD- Please consider the following in-hospital insulin adjustments:  1. Increase Lantus to 55 units bid (10% increase)  2. Add Novolog Meal Coverage- Novolog 6 units tidwc     Will follow Ladashia Demarinis Tawanna Sat RN, MSN, CDE Diabetes Coordinator Inpatient Glycemic Control Team Team Pager: [phone] (8a-5p)

## 2015-04-14 LAB — COMPREHENSIVE METABOLIC PANEL
ALBUMIN: 3.5 g/dL (ref 3.5–5.0)
ALT: 25 U/L (ref 17–63)
ANION GAP: 7 (ref 5–15)
AST: 18 U/L (ref 15–41)
Alkaline Phosphatase: 61 U/L (ref 38–126)
BUN: 12 mg/dL (ref 6–20)
CALCIUM: 9.1 mg/dL (ref 8.9–10.3)
CHLORIDE: 102 mmol/L (ref 101–111)
CO2: 30 mmol/L (ref 22–32)
Creatinine, Ser: 0.94 mg/dL (ref 0.61–1.24)
GFR calc Af Amer: >60 mL/min (ref >60)
GFR calc non Af Amer: >60 mL/min (ref >60)
Glucose, Bld: 263 mg/dL — ABNORMAL HIGH (ref 65–99)
Potassium: 4.6 mmol/L (ref 3.5–5.1)
Sodium: 139 mmol/L (ref 135–145)
Total Bilirubin: 0.4 mg/dL (ref 0.3–1.2)
Total Protein: 7.4 g/dL (ref 6.5–8.1)

## 2015-04-14 LAB — CBC
HCT: 39.8 % (ref 39.0–52.0)
Hemoglobin: 13.3 g/dL (ref 13.0–17.0)
MCH: 31 pg (ref 26.0–34.0)
MCHC: 33.4 g/dL (ref 30.0–36.0)
MCV: 92.8 fL (ref 78.0–100.0)
PLATELETS: 159 10*3/uL (ref 150–400)
RBC: 4.29 MIL/uL (ref 4.22–5.81)
RDW: 12.9 % (ref 11.5–15.5)
WBC: 5.5 10*3/uL (ref 4.0–10.5)

## 2015-04-14 LAB — GLUCOSE, CAPILLARY: GLUCOSE-CAPILLARY: 243 mg/dL — AB (ref 65–99)

## 2015-04-14 MED ORDER — HYDROCODONE-ACETAMINOPHEN 5-325 MG PO TABS: 1.0000 | ORAL_TABLET | ORAL | Status: DC | PRN

## 2015-04-14 MED ORDER — DOXYCYCLINE HYCLATE 100 MG PO TABS: 100.0000 mg | ORAL_TABLET | Freq: Two times a day (BID) | ORAL | Status: DC

## 2015-04-14 MED ORDER — AMOXICILLIN 500 MG PO CAPS: 500.0000 mg | ORAL_CAPSULE | Freq: Three times a day (TID) | ORAL | Status: DC

## 2015-04-14 NOTE — Care Management Important Message (Signed)
Important Message  Patient Details  Name: [name] MRN: [id number] Date of Birth: [birth year]   Medicare Important Message Given:  Yes-second notification given    Haskell Flirt 04/14/2015, 2:12 PMImportant Message  Patient Details  Name: [name] MRN: [id number] Date of Birth: 11-22-1960   Medicare Important Message Given:  Yes-second notification given    Haskell Flirt 04/14/2015, 2:12 PM

## 2015-04-14 NOTE — Discharge Summary (Signed)
Physician Discharge Summary  [name]:[id number] DOB: 1960/09/26 DOA: 04/11/2015  PCP: [name], MD  Admit date: 04/11/2015 Discharge date: 04/14/2015  Time spent: 20 minutes  Recommendations for Outpatient Follow-up:  1. Medication changes Order on Discharge  - amoxicillin (AMOXIL) 500 MG capsule till 04/20/15  - HYDROcodone-acetaminophen (NORCO/VICODIN) 5-325 MG per tablet  Modify on Discharge -doxycycline 100 bid till 8/1/6 2.  consider CBC + diff in 1-2 weeks 3. Wound check as OP  Discharge Diagnoses:  Principal Problem:   Cellulitis of leg, right Active Problems:   DM2 (diabetes mellitus, type 2)   Essential hypertension   GERD   OSA (obstructive sleep apnea)   Sleep-related hypoventilation   Impingement syndrome of right shoulder   Morbid obesity with BMI of 50.0-59.9, adult   Chest pain on breathing   Discharge Condition: stable  Diet recommendation: heart healthy low salt  Filed Weights   04/12/15 0425 04/13/15 0526 04/14/15 0504  Weight: 154.3 kg (340 lb 2.7 oz) 154.7 kg (341 lb 0.8 oz) 152.1 kg (335 lb 5.1 oz)    History of present illness:   54 y/o ? 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 He was admitted for a non-purulent cellulitis and broad sp[ectrum Abx were started  Hospital Course:  Fever, myalgia and rash-likely secondary to cellulitis of the right lower extremity, DDX = viral syndrome Narrowed antibiotics rapidly 7/25 to by mouth doxycycline for MRSA, and for Streptococcus coverage  Added Amioxil as well  Body mass index is 46.24 kg/(m^2). 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 On lovenox prophylactic  now continue ASA 325 daily  Right shoulder repair 04/10/15 I will cc her orthopedic surgeon Dr. Magnus Ivan who just did surgery on her to let him know He has follow up on Thursday 7/28 and uinderstands his precautions  Hypertension Continue losartan 25 daily  Diabetes mellitus type 2 with neuropathy-A1c was 7.5 this admit continue lantus 50 bid Tid ACS coverage 200 ranges  Bipolar Continue Duloxetine 60 bid  Consultants:  None yet   Antibiotics:  Cafazolin  LEvaquin  Vancomycin  All changed to Doxy 7/24  Discharge Exam: Filed Vitals:   04/14/15 0504  BP: 140/70  Pulse: 69  Temp: 97.7 F (36.5 C)  Resp: 16    General: alert pleasant oriented in nad Cardiovascular: s1 s2 no m/r/g Respiratory: clear no added sound  Discharge Instructions   Discharge Instructions    Diet - low sodium heart healthy    Complete by:  As directed      Discharge instructions    Complete by:  As directed   Finish ALL the antibiotics prescribed for you You need to finish BOTH the Amoxicillin AND the Doxycycline -you might not need to pick up the doxycycline if you have enough tablets at home Good luck and happy summer     Increase activity slowly    Complete by:  As directed           Current Discharge Medication List    START taking these medications   Details  amoxicillin (AMOXIL) 500 MG capsule Take 1 capsule (500 mg total) by mouth 3 (three) times daily. Qty: 19 capsule, Refills: 9    HYDROcodone-acetaminophen (NORCO/VICODIN)  5-325 MG per tablet Take 1-2 tablets by mouth every 4 (four) hours as needed for moderate pain. Qty: 30 tablet, Refills: 0      CONTINUE these medications which have CHANGED   Details  doxycycline (VIBRA-TABS) 100 MG tablet Take 1 tablet (100 mg total) by mouth 2 (two) times daily. Qty: 13 tablet, Refills: 0      CONTINUE these medications which have NOT CHANGED   Details  aspirin 325 MG tablet Take 325 mg by mouth daily.     diphenhydrAMINE (BENADRYL) 25 mg capsule Take 25 mg by mouth every 6 (six) hours as needed for itching.    DULoxetine (CYMBALTA) 60 MG capsule Take 60 mg by mouth 2 (two) times daily.    ibuprofen (ADVIL,MOTRIN) 200 MG tablet Take 800-1,000 mg by mouth every 6 (six) hours as needed for mild pain or moderate pain.    insulin aspart (NOVOLOG) 100 UNIT/ML injection Inject 40-80 Units into the skin 3 (three) times daily before meals. Sliding scale    insulin glargine (LANTUS) 100 UNIT/ML injection Inject 50 Units into the skin 2 (two) times daily.     losartan (COZAAR) 25 MG tablet Take 25 mg by mouth daily.    oxyCODONE-acetaminophen (ROXICET) 5-325 MG per tablet Take 1-2 tablets by mouth every 4 (four) hours as needed. Qty: 60 tablet, Refills: 0      STOP taking these medications     Multiple Vitamin (MULTIVITAMIN WITH MINERALS) TABS tablet        No Known Allergies    The results of significant diagnostics from this hospitalization (including imaging, microbiology, ancillary and laboratory) are listed below for reference.    Significant Diagnostic Studies: Dg Chest 1 View  04/11/2015   CLINICAL DATA:  Myalgia, history of right shoulder surgery  EXAM: CHEST  1 VIEW  COMPARISON:  CT same date and chest CT 07/01/2013  FINDINGS: Heart size upper limits of normal, likely related to hypoaeration and AP technique. Lungs are hypoaerated with crowding of the bronchovascular markings. No focal pulmonary opacity. No pleural effusion.  IMPRESSION: No gross evidence for focal acute cardiopulmonary process.   Electronically Signed   By: Christiana Pellant M.D.   On: 04/11/2015 12:09   Ct Angio Chest Pe W/cm &/or Wo Cm  04/11/2015   CLINICAL DATA:  History of recent shoulder surgery with new chest pain  EXAM: CT ANGIOGRAPHY CHEST WITH CONTRAST  TECHNIQUE: Multidetector CT imaging of the chest was performed using the standard protocol during bolus administration of intravenous contrast. Multiplanar CT  image reconstructions and MIPs were obtained to evaluate the vascular anatomy.  CONTRAST:  OMNIPAQUE IOHEXOL 350 MG/ML SOLN  COMPARISON:  None.  FINDINGS: The lungs are well aerated bilaterally without focal infiltrate or sizable effusion.  The thoracic inlet is unremarkable. The thoracic aorta and its branches are within normal limits. The pulmonary artery shows a normal branching pattern. No filling defects to suggest pulmonary embolism are identified. No significant hilar or mediastinal adenopathy is seen. Mild coronary calcifications are noted.  The upper abdomen shows no acute abnormality. No acute bony abnormality is noted.  Review of the MIP images confirms the above findings.  IMPRESSION: No evidence of pulmonary emboli.  No acute abnormality is seen.   Electronically Signed   By: Alcide Clever M.D.   On: 04/11/2015 11:34    Microbiology: No results found for this or any previous visit (from the past 240 hour(s)).   Labs: Basic Metabolic Panel:  Recent Labs  Lab 04/07/15 1055 04/11/15 1015 04/11/15 1905 04/12/15 0117 04/14/15 0533  NA 141 138  --   --  139  K 3.9 4.1  --   --  4.6  CL 105 104  --   --  102  CO2 28 27  --   --  30  GLUCOSE 196* 196*  --   --  263*  BUN 16 15  --   --  12  CREATININE 0.97 1.01 0.90  --  0.94  CALCIUM 9.2 8.6*  --   --  9.1  MG  --   --   --  1.8  --   PHOS  --   --   --  3.1  --    Liver Function Tests:  Recent Labs Lab 04/14/15 0533  AST 18  ALT 25  ALKPHOS 61  BILITOT 0.4  PROT 7.4  ALBUMIN 3.5   No results for input(s): LIPASE, AMYLASE in the last 168 hours. No results for input(s): AMMONIA in the last 168 hours. CBC:  Recent Labs Lab 04/07/15 1055 04/11/15 1015 04/11/15 1905 04/12/15 0117 04/14/15 0533  WBC 6.3 7.9 6.3 6.5 5.5  NEUTROABS  --   --   --  4.0  --   HGB 13.9 14.4 13.2 13.1 13.3  HCT 42.2 42.8 39.5 38.3* 39.8  MCV 93.0 93.0 93.8 94.3 92.8  PLT 147* 143* 140* 141* 159   Cardiac Enzymes:  Recent  Labs Lab 04/11/15 1015 04/11/15 1336 04/11/15 1905 04/12/15 0117  CKTOTAL 442*  --   --   --   TROPONINI <0.03 <0.03 <0.03 <0.03   BNP: BNP (last 3 results) No results for input(s): BNP in the last 8760 hours.  ProBNP (last 3 results) No results for input(s): PROBNP in the last 8760 hours.  CBG:  Recent Labs Lab 04/13/15 0734 04/13/15 1202 04/13/15 1628 04/13/15 2057 04/14/15 0735  GLUCAP 242* 267* 189* 287* 243*       Signed:  Rhetta Mura  [facility] 04/14/2015, 9:21 AM

## 2015-05-06 ENCOUNTER — Institutional Professional Consult (permissible substitution): Payer: 59 | Admitting: Pulmonary Disease

## 2015-05-11 ENCOUNTER — Other Ambulatory Visit: Payer: Self-pay | Admitting: Physician Assistant

## 2015-05-11 DIAGNOSIS — M25522 Pain in left elbow: Secondary | ICD-10-CM

## 2015-05-26 ENCOUNTER — Ambulatory Visit
Admission: RE | Admit: 2015-05-26 | Discharge: 2015-05-26 | Disposition: A | Discharge status: Home / Self Care | Payer: Medicare Other | Source: Ambulatory Visit | Attending: Physician Assistant | Admitting: Physician Assistant

## 2015-05-26 DIAGNOSIS — M25522 Pain in left elbow: Secondary | ICD-10-CM

## 2016-05-30 ENCOUNTER — Encounter (HOSPITAL_COMMUNITY): Payer: Self-pay | Admitting: Licensed Clinical Social Worker

## 2016-05-30 ENCOUNTER — Emergency Department (HOSPITAL_COMMUNITY)
Admission: EM | Admit: 2016-05-30 | Discharge: 2016-05-30 | Disposition: A | Discharge status: Home / Self Care | Payer: Medicare Other | Attending: Emergency Medicine | Admitting: Emergency Medicine

## 2016-05-30 DIAGNOSIS — Z794 Long term (current) use of insulin: Secondary | ICD-10-CM | POA: Diagnosis not present

## 2016-05-30 DIAGNOSIS — R45851 Suicidal ideations: Secondary | ICD-10-CM | POA: Insufficient documentation

## 2016-05-30 DIAGNOSIS — Z79899 Other long term (current) drug therapy: Secondary | ICD-10-CM | POA: Diagnosis not present

## 2016-05-30 DIAGNOSIS — Z791 Long term (current) use of non-steroidal anti-inflammatories (NSAID): Secondary | ICD-10-CM | POA: Diagnosis not present

## 2016-05-30 DIAGNOSIS — I10 Essential (primary) hypertension: Secondary | ICD-10-CM | POA: Diagnosis not present

## 2016-05-30 DIAGNOSIS — E119 Type 2 diabetes mellitus without complications: Secondary | ICD-10-CM | POA: Diagnosis not present

## 2016-05-30 DIAGNOSIS — Z046 Encounter for general psychiatric examination, requested by authority: Secondary | ICD-10-CM | POA: Diagnosis present

## 2016-05-30 LAB — CBC
HEMATOCRIT: 43 % (ref 39.0–52.0)
HEMOGLOBIN: 14.9 g/dL (ref 13.0–17.0)
MCH: 31.3 pg (ref 26.0–34.0)
MCHC: 34.7 g/dL (ref 30.0–36.0)
MCV: 90.3 fL (ref 78.0–100.0)
Platelets: 185 10*3/uL (ref 150–400)
RBC: 4.76 MIL/uL (ref 4.22–5.81)
RDW: 13 % (ref 11.5–15.5)
WBC: 9.6 10*3/uL (ref 4.0–10.5)

## 2016-05-30 LAB — COMPREHENSIVE METABOLIC PANEL
ALBUMIN: 4.4 g/dL (ref 3.5–5.0)
ALK PHOS: 62 U/L (ref 38–126)
ALT: 32 U/L (ref 17–63)
AST: 36 U/L (ref 15–41)
Anion gap: 11 (ref 5–15)
BUN: 15 mg/dL (ref 6–20)
CALCIUM: 9.1 mg/dL (ref 8.9–10.3)
CO2: 23 mmol/L (ref 22–32)
CREATININE: 0.97 mg/dL (ref 0.61–1.24)
Chloride: 102 mmol/L (ref 101–111)
GFR calc non Af Amer: >60 mL/min (ref >60)
GLUCOSE: 355 mg/dL — AB (ref 65–99)
Potassium: 4.1 mmol/L (ref 3.5–5.1)
SODIUM: 136 mmol/L (ref 135–145)
Total Bilirubin: 0.8 mg/dL (ref 0.3–1.2)
Total Protein: 8.1 g/dL (ref 6.5–8.1)

## 2016-05-30 LAB — RAPID URINE DRUG SCREEN, HOSP PERFORMED
Amphetamines: NOT DETECTED
BARBITURATES: NOT DETECTED
Benzodiazepines: NOT DETECTED
Cocaine: NOT DETECTED
Opiates: NOT DETECTED
TETRAHYDROCANNABINOL: NOT DETECTED

## 2016-05-30 LAB — ACETAMINOPHEN LEVEL: Acetaminophen (Tylenol), Serum: <10 ug/mL — ABNORMAL LOW (ref 10–30)

## 2016-05-30 LAB — ETHANOL: Alcohol, Ethyl (B): <5 mg/dL (ref <5)

## 2016-05-30 LAB — SALICYLATE LEVEL: Salicylate Lvl: <4 mg/dL (ref 2.8–30.0)

## 2016-05-30 MED ORDER — INSULIN GLARGINE 100 UNIT/ML ~~LOC~~ SOLN
50.0000 [IU] | Freq: Two times a day (BID) | SUBCUTANEOUS | Status: DC
  Filled 2016-05-30: qty 0.5

## 2016-05-30 MED ORDER — INSULIN ASPART 100 UNIT/ML ~~LOC~~ SOLN: 40.0000 [IU] | Freq: Three times a day (TID) | SUBCUTANEOUS | Status: DC

## 2016-05-30 MED ORDER — DULOXETINE HCL 60 MG PO CPEP
60.0000 mg | ORAL_CAPSULE | Freq: Two times a day (BID) | ORAL | Status: DC
  Filled 2016-05-30: qty 1

## 2016-05-30 MED ORDER — IBUPROFEN 800 MG PO TABS: 800.0000 mg | ORAL_TABLET | Freq: Four times a day (QID) | ORAL | Status: DC | PRN

## 2016-05-30 MED ORDER — LOSARTAN POTASSIUM 25 MG PO TABS
25.0000 mg | ORAL_TABLET | Freq: Every day | ORAL | Status: DC
  Filled 2016-05-30: qty 1

## 2016-05-30 NOTE — ED Notes (Signed)
Bed: WTR7 Expected date:  Expected time:  Means of arrival:  Comments: 

## 2016-05-30 NOTE — Progress Notes (Signed)
Inpatient Diabetes Program Recommendations  AACE/ADA: New Consensus Statement on Inpatient Glycemic Control (2015)  Target Ranges:  Prepandial:   less than 140 mg/dL      Peak postprandial:   less than 180 mg/dL (1-2 hours)      Critically ill patients:  140 - 180 mg/dL   Lab Results  Component Value Date   GLUCAP 243 (H) 04/14/2015   HGBA1C 7.5 (H) 04/12/2015    Review of Glycemic Control  Diabetes history: DM2 Outpatient Diabetes medications: Lantus 50 units bid, Novolog 40-80 units tidwc Current orders for Inpatient glycemic control: None  Inpatient Diabetes Program Recommendations:    Lantus 30 units bid (Home dose is 50 units bid) Novolog resistant tidwc and hs + 10 units tidwc for meal coverage insulin. HgbA1C to assess glycemic control prior to admission.  Will follow.  Thank you. [name], RD, LDN, CDE Inpatient Diabetes Coordinator [phone]

## 2016-05-30 NOTE — BH Assessment (Signed)
Assessment Note  [name] is an 55 y.o. male presents this date under IVC. Patient was involved in a stand off with [employer] last night from 1700 yesterday to 1100 today. Collateral gather from Detective Eddie Bruscino ([employer]) stated patient made threats of self harm (suicide by police) during the standoff. This Clinical research associate assessed patient this date with patient denying any S/I, H/I or AVH. Patient's BAL was less than 5 and UDS was negative. Patient denied making any statements of self harm but did admit to being in a altercation with [employer]. Patient denied any SA issues, prior attempts at self harm or outpatient/inpatient admissions for any mental health issues. IVC stated: "Respondent told his son he was going to shoot his wife and the police. Respondent also told his wife via cell phone that he wants the police to come to his home and kill him. Respondent has discharged a firearm while police were there and also discharged the firearm three times prior to police arriving." Patient was plesant and presented with appropriate affect. Patient was oriented to time/place. Plunkett MD also interviewed patient as case was also staffed with Cresenciano Genre, both agreed patient did not meet criteria for IVC. Plunkett MD rescinded IVC and patient was informed of pending charges. Patient will be discharged and released to the custody of [employer].   Diagnosis: Deferred  Past Medical History:  Past Medical History:  Diagnosis Date  . Arthritis   . Diabetes mellitus age 29  . DVT (deep venous thrombosis) (HCC)   . Hyperlipidemia   . Hypertension   . Phlebitis   . Plantar fasciitis    left foot  . Sleep apnea    wears c-pap machine  . Sleep-related hypoventilation 09/05/2013  . Venous insufficiency (chronic) (peripheral)     Past Surgical History:  Procedure Laterality Date  . APPENDECTOMY    . COLONOSCOPY N/A 09/06/2013   Procedure: COLONOSCOPY;  Surgeon: Petra Kuba, MD;  Location: [facility];  Service: Endoscopy;   Laterality: N/A;  . KNEE SURGERY     right knee arthroscopy  . NOSE SURGERY    . SHOULDER ARTHROSCOPY Right 04/10/2015   Procedure: RIGHT SHOULDER ARTHROSCOPY WITH DEBRIDEMENT, SUBACROMIAL DECOMPRESSION;  Surgeon: Kathryne Hitch, MD;  Location: [facility];  Service: Orthopedics;  Laterality: Right;  . SHOULDER SURGERY     left shoulder for labral tear    Family History:  Family History  Problem Relation Age of Onset  . Cancer Mother     ovarian  . Hypertension Mother   . Arthritis Mother   . Diabetes Mother   . Cancer Father     colon    Social History:  reports that he has never smoked. He has never used smokeless tobacco. He reports that he does not drink alcohol or use drugs.  Additional Social History:  Alcohol / Drug Use Pain Medications: See MAR Prescriptions: See MAR Over the Counter: See MAR History of alcohol / drug use?: No history of alcohol / drug abuse  CIWA: CIWA-Ar BP: 166/85 Pulse Rate: 93 COWS:    Allergies: No Known Allergies  Home Medications:  (Not in a hospital admission)  OB/GYN Status:  No LMP for male patient.  General Assessment Data Location of Assessment: [facility] TTS Assessment: In system Is this a Tele or Face-to-Face Assessment?: Face-to-Face Is this an Initial Assessment or a Re-assessment for this encounter?: Initial Assessment Marital status: Married [place] name: na Is patient pregnant?: No Pregnancy Status: No Living Arrangements: Spouse/significant  other Can pt return to current living arrangement?: Yes Admission Status: Involuntary Is patient capable of signing voluntary admission?: Yes Referral Source: Other ([employer]) Insurance type: [employer]   Medical Screening Exam [facility] Walk-in ONLY) Medical Exam completed: Yes  Crisis Care Plan Living Arrangements: Spouse/significant other Legal Guardian: Other: (na) Name of Psychiatrist: None Name of Therapist: None  Education Status Is patient currently in  school?: No Current Grade: na Highest grade of school patient has completed: 12 Name of school: na Contact person: na  Risk to self with the past 6 months Suicidal Ideation: No Has patient been a risk to self within the past 6 months prior to admission? : No Suicidal Intent: No Has patient had any suicidal intent within the past 6 months prior to admission? : No Is patient at risk for suicide?: No Suicidal Plan?: No Has patient had any suicidal plan within the past 6 months prior to admission? : No Access to Means: Yes Specify Access to Suicidal Means: pt does have weapons What has been your use of drugs/alcohol within the last 12 months?: denies Previous Attempts/Gestures: No How many times?: 0 Other Self Harm Risks: none Triggers for Past Attempts: Unknown Intentional Self Injurious Behavior: None Family Suicide History: No Recent stressful life event(s): Other (Comment) Persecutory voices/beliefs?: No Depression: No Depression Symptoms:  (na) Substance abuse history and/or treatment for substance abuse?: No Suicide prevention information given to non-admitted patients: Not applicable  Risk to Others within the past 6 months Homicidal Ideation: No Does patient have any lifetime risk of violence toward others beyond the six months prior to admission? : No Thoughts of Harm to Others: No Current Homicidal Intent: No Current Homicidal Plan: No Access to Homicidal Means: No Identified Victim: na History of harm to others?: No Assessment of Violence: None Noted Violent Behavior Description: na Does patient have access to weapons?: Yes (Comment) Criminal Charges Pending?: Yes Describe Pending Criminal Charges: not known at this time Does patient have a court date: No Is patient on probation?: No  Psychosis Hallucinations: None noted Delusions: None noted  Mental Status Report Appearance/Hygiene: In scrubs Eye Contact: Good Motor Activity: Freedom of movement Speech:  Logical/coherent Level of Consciousness: Alert Mood: Pleasant Affect: Appropriate to circumstance Anxiety Level: Minimal Thought Processes: Coherent, Relevant Judgement: Unimpaired Orientation: Person, Place, Time Obsessive Compulsive Thoughts/Behaviors: None  Cognitive Functioning Concentration: Normal Memory: Recent Intact, Remote Intact IQ: Average Insight: Fair Impulse Control: Fair Appetite: Good Weight Loss: 0 Weight Gain: 0 Sleep: No Change Total Hours of Sleep: 8 Vegetative Symptoms: None  ADLScreening [facility]) Patient's cognitive ability adequate to safely complete daily activities?: Yes Patient able to express need for assistance with ADLs?: Yes Independently performs ADLs?: Yes (appropriate for developmental age)  Prior Inpatient Therapy Prior Inpatient Therapy: No Prior Therapy Dates: na Prior Therapy Facilty/Provider(s): na Reason for Treatment: na  Prior Outpatient Therapy Prior Outpatient Therapy: No Prior Therapy Dates: na Prior Therapy Facilty/Provider(s): none Reason for Treatment: na Does patient have an ACCT team?: No Does patient have Intensive In-House Services?  : No Does patient have [facility] services? : No Does patient have P4CC services?: No  ADL Screening (condition at time of admission) Patient's cognitive ability adequate to safely complete daily activities?: Yes Is the patient deaf or have difficulty hearing?: No Does the patient have difficulty seeing, even when wearing glasses/contacts?: No Does the patient have difficulty concentrating, remembering, or making decisions?: No Patient able to express need for assistance with ADLs?: Yes  Does the patient have difficulty dressing or bathing?: No Independently performs ADLs?: Yes (appropriate for developmental age) Does the patient have difficulty walking or climbing stairs?: No Weakness of Legs: None Weakness of Arms/Hands: None  Home Assistive Devices/Equipment Home  Assistive Devices/Equipment: None  Therapy Consults (therapy consults require a physician order) PT Evaluation Needed: No OT Evalulation Needed: No SLP Evaluation Needed: No Abuse/Neglect Assessment (Assessment to be complete while patient is alone) Physical Abuse: Denies Verbal Abuse: Denies Sexual Abuse: Denies Exploitation of patient/patient's resources: Denies Self-Neglect: Denies Values / Beliefs Cultural Requests During Hospitalization: None Spiritual Requests During Hospitalization: None Consults Spiritual Care Consult Needed: No Social Work Consult Needed: No Merchant navy officerAdvance Directives (For Healthcare) Does patient have an advance directive?: No Would patient like information on creating an advanced directive?: No - patient declined information (pt declined)    Additional Information 1:1 In Past 12 Months?: No CIRT Risk: No Elopement Risk: No Does patient have medical clearance?: Yes     Disposition: Plunkett MD also interviewed patient as case was also staffed with Cresenciano GenreLord DNP, both agreed patient did not meet criteria for IVC. Plunkett MD rescinded IVC and patient was informed of pending charges. Patient will be discharged and released to the custody of [employer].   Disposition Initial Assessment Completed for this Encounter: Yes Disposition of Patient: Other dispositions Other disposition(s):  [facility]  On Site Evaluation by:   Reviewed with Physician:    Alfredia Fergusonavid L Yarnell Arvidson 05/30/2016 4:33 PM

## 2016-05-30 NOTE — ED Provider Notes (Addendum)
[facility]-EM[facility]PT Provider Note   CSN: [id number] Arrival date & time: 05/30/16  1132     History   Chief Complaint Chief Complaint  Patient presents with  . IVC    HPI [name] is a 55 y.o. male.  Patient is a 55 year old male with a history of morbid obesity, diabetes, hypertension, hyperlipidemia but no prior mental health conditions presenting today with the police after an 18 hour holdup. Patient states that on Saturday his son got married however it was about encounter with his son's wife's family. He ended up leaving the med wedding and was very upset. Wife came home and found the patient had a gun and tried to get it away from him. He then called the police hoping that they can just see him. He did post on social media that he was going to kill himself. Patient currently denies all of this. He states he is not hurt himself or anybody else. He does have access to weapons.      Past Medical History:  Diagnosis Date  . Arthritis   . Diabetes mellitus age 55  . DVT (deep venous thrombosis)   . Hyperlipidemia   . Hypertension   . Phlebitis   . Plantar fasciitis    left foot  . Sleep apnea    wears c-pap machine  . Sleep-related hypoventilation 09/05/2013  . Venous insufficiency (chronic) (peripheral)     Patient Active Problem List   Diagnosis Date Noted  . Cellulitis 04/11/2015  . Morbid obesity with BMI of 50.0-59.9, adult (HCC) 04/11/2015  . Chest pain on breathing 04/11/2015  . Cellulitis of leg, right 04/11/2015  . Impingement syndrome of right shoulder 04/10/2015  . OSA (obstructive sleep apnea) 09/05/2013  . Sleep-related hypoventilation 09/05/2013  . Venous insufficiency 10/11/2011  . HYPOGONADISM 07/11/2008  . THROMBOCYTOPENIA 07/11/2008  . OTHER CHRONIC NONALCOHOLIC LIVER DISEASE 09/11/2007  . BACK PAIN, LUMBAR, CHRONIC 09/11/2007  . FOOT PAIN 09/11/2007  . DM2 (diabetes mellitus, type 2) (HCC) 05/18/2007  . GOUT 05/18/2007  . Essential  hypertension 05/18/2007  . ALLERGIC RHINITIS 05/18/2007  . GERD 05/18/2007    Past Surgical History:  Procedure Laterality Date  . APPENDECTOMY    . COLONOSCOPY N/A 09/06/2013   Procedure: COLONOSCOPY;  Surgeon: Petra KubaMarc E Magod, MD;  Location: [facility];  Service: Endoscopy;  Laterality: N/A;  . KNEE SURGERY     right knee arthroscopy  . NOSE SURGERY    . SHOULDER ARTHROSCOPY Right 04/10/2015   Procedure: RIGHT SHOULDER ARTHROSCOPY WITH DEBRIDEMENT, SUBACROMIAL DECOMPRESSION;  Surgeon: Kathryne Hitchhristopher Y Blackman, MD;  Location: [facility];  Service: Orthopedics;  Laterality: Right;  . SHOULDER SURGERY     left shoulder for labral tear       Home Medications    Prior to Admission medications   Medication Sig Start Date End Date Taking? Authorizing Provider  amoxicillin (AMOXIL) 500 MG capsule Take 1 capsule (500 mg total) by mouth 3 (three) times daily. 04/14/15   Rhetta MuraJai-Gurmukh Samtani, MD  aspirin 325 MG tablet Take 325 mg by mouth daily.    Historical Provider, MD  diphenhydrAMINE (BENADRYL) 25 mg capsule Take 25 mg by mouth every 6 (six) hours as needed for itching.    Historical Provider, MD  doxycycline (VIBRA-TABS) 100 MG tablet Take 1 tablet (100 mg total) by mouth 2 (two) times daily. 04/14/15   Rhetta MuraJai-Gurmukh Samtani, MD  DULoxetine (CYMBALTA) 60 MG capsule Take 60 mg by mouth 2 (two) times daily.  Historical Provider, MD  HYDROcodone-acetaminophen (NORCO/VICODIN) 5-325 MG per tablet Take 1-2 tablets by mouth every 4 (four) hours as needed for moderate pain. 04/14/15   Rhetta Mura, MD  ibuprofen (ADVIL,MOTRIN) 200 MG tablet Take 800-1,000 mg by mouth every 6 (six) hours as needed for mild pain or moderate pain.    Historical Provider, MD  insulin aspart (NOVOLOG) 100 UNIT/ML injection Inject 40-80 Units into the skin 3 (three) times daily before meals. Sliding scale    Historical Provider, MD  insulin glargine (LANTUS) 100 UNIT/ML injection Inject 50 Units into the skin 2 (two)  times daily.     Historical Provider, MD  losartan (COZAAR) 25 MG tablet Take 25 mg by mouth daily.    Historical Provider, MD  oxyCODONE-acetaminophen (ROXICET) 5-325 MG per tablet Take 1-2 tablets by mouth every 4 (four) hours as needed. 04/10/15   Kathryne Hitch, MD    Family History Family History  Problem Relation Age of Onset  . Cancer Mother     ovarian  . Hypertension Mother   . Arthritis Mother   . Diabetes Mother   . Cancer Father     colon    Social History Social History  Substance Use Topics  . Smoking status: Never Smoker  . Smokeless tobacco: Never Used  . Alcohol use No     Allergies   Review of patient's allergies indicates no known allergies.   Review of Systems Review of Systems  All other systems reviewed and are negative.    Physical Exam Updated Vital Signs BP 160/82 (BP Location: Right Arm)   Pulse 92   Temp 98.1 F (36.7 C) (Oral)   Resp 16   Ht 6' (1.829 m)   Wt (!) 312 lb (141.5 kg)   SpO2 96%   BMI 42.31 kg/m   Physical Exam  Constitutional: He is oriented to person, place, and time. He appears well-developed and well-nourished. No distress.  HENT:  Head: Normocephalic and atraumatic.  Mouth/Throat: Oropharynx is clear and moist.  Eyes: Conjunctivae and EOM are normal. Pupils are equal, round, and reactive to light.  Neck: Normal range of motion. Neck supple.  Cardiovascular: Normal rate, regular rhythm and intact distal pulses.   No murmur heard. Pulmonary/Chest: Effort normal and breath sounds normal. No respiratory distress. He has no wheezes. He has no rales.  Abdominal: Soft. He exhibits no distension. There is no tenderness. There is no rebound and no guarding.  Musculoskeletal: Normal range of motion. He exhibits no edema or tenderness.  Neurological: He is alert and oriented to person, place, and time.  Skin: Skin is warm and dry. No rash noted. No erythema.  Psychiatric: He has a normal mood and affect. His  speech is normal and behavior is normal. Thought content is not paranoid and not delusional. He expresses no homicidal and no suicidal ideation. He expresses no homicidal plans.  Patient currently is denying suicidal or homicidal ideation  Nursing note and vitals reviewed.    ED Treatments / Results  Labs (all labs ordered are listed, but only abnormal results are displayed) Labs Reviewed  COMPREHENSIVE METABOLIC PANEL - Abnormal; Notable for the following:       Result Value   Glucose, Bld 355 (*)    All other components within normal limits  ACETAMINOPHEN LEVEL - Abnormal; Notable for the following:    Acetaminophen (Tylenol), Serum <10 (*)    All other components within normal limits  ETHANOL  SALICYLATE LEVEL  CBC  URINE RAPID DRUG SCREEN, HOSP PERFORMED    EKG  EKG Interpretation None       Radiology No results found.  Procedures Procedures (including critical care time)  Medications Ordered in ED Medications - No data to display   Initial Impression / Assessment and Plan / ED Course  I have reviewed the triage vital signs and the nursing notes.  Pertinent labs & imaging results that were available during my care of the patient were reviewed by me and considered in my medical decision making (see chart for details).  Clinical Course   Patient being brought in by [employer] for suicidal ideation. Patient cannot face but messages yesterday where he told everyone he was going to kill himself after about encounter at his son's wedding. Initially had a gun and was ready to shoot himself and his wife stopped him. He then called the police and figured that they could shoot him. He was in a holdup for 18 hours and then brought here. Patient currently denies any suicidal or homicidal ideation. He is cooperative and calm. Labs are within normal limits and he is currently medically clear. TTS to evaluate.  3:53 PM TTS evaluated the pt and does not feel pt meets criteria.  Pt is  currently denying si/hi.  No prior hx of same or mental illness.  Pt states he may have said something in the heat of the moment but states he had no intention of killing himself or anyone else.  Pt also going to jail where he can be on suicide watch is needed but low suspicion.  Psychiatrist also feels that pt does not meet commitment criteria and can have IVC rescinded and pt can go with police  Final Clinical Impressions(s) / ED Diagnoses   Final diagnoses:  Suicidal ideation    New Prescriptions New Prescriptions   No medications on file     Gwyneth Sprout, MD 05/30/16 1517    Gwyneth Sprout, MD 05/30/16 1556

## 2016-05-30 NOTE — ED Triage Notes (Signed)
Pt BIB [employer] IVC.  Pt was involved in a stand off with [employer] last night from 1700 yesterday to 1100 today.  He was shooting at police.    IVC papers state: "Respondent told his son he was going to shoot his wife and the police.  Respondent also told his wife via cell phone that he wants the police to come to his home and kill hi.  Respondent has discharged a firearm while police were there and also discharged the firearm three times prior to police arriving. "

## 2016-07-26 ENCOUNTER — Telehealth (INDEPENDENT_AMBULATORY_CARE_PROVIDER_SITE_OTHER): Payer: Self-pay | Admitting: Orthopaedic Surgery

## 2016-07-26 NOTE — Telephone Encounter (Signed)
Patient's wife Drue FlirtCandy would like for Dr Magnus IvanBlackman to write a letter stating [name]'s disability. The letter [facility] [name].

## 2016-07-26 NOTE — Telephone Encounter (Signed)
See below

## 2016-07-27 ENCOUNTER — Encounter (INDEPENDENT_AMBULATORY_CARE_PROVIDER_SITE_OTHER): Payer: Self-pay | Admitting: Orthopaedic Surgery

## 2016-07-27 NOTE — Telephone Encounter (Signed)
Pt aware note ready

## 2016-07-27 NOTE — Telephone Encounter (Signed)
I dictated a letter

## 2016-07-27 NOTE — Progress Notes (Signed)
To Whom It May Concern:  Martin BickersRichard W Armenti is a patient of mine that I have been seeing since 2011. He has multiple medical problems that have created significant disabilities for him. He has a history of diabetes as well as chronic shoulder and knee problems. His pain has been daily and it has detrimentally affected his activities daily living, his mobility, and his quality of life. He also has a history of DVTs which have created chronic swelling in his legs and the need to be on chronic blood thinning medication. He also has a history of sleep apnea. Combination of all these problems certainly has contributed to the level of anxiety and Mr. [name].  If I can be of further assistance regarding Mr. Secord's medical history please do not hesitate to call.  Sincerely    Kathryne Hitchhristopher Y Kathan Kirker, M.D.

## 2016-09-09 ENCOUNTER — Encounter (HOSPITAL_COMMUNITY): Payer: Medicare Other

## 2016-10-17 ENCOUNTER — Ambulatory Visit (INDEPENDENT_AMBULATORY_CARE_PROVIDER_SITE_OTHER): Payer: Self-pay | Admitting: Orthopaedic Surgery

## 2016-10-17 DIAGNOSIS — M25511 Pain in right shoulder: Secondary | ICD-10-CM | POA: Diagnosis not present

## 2016-10-17 MED ORDER — TIZANIDINE HCL 4 MG PO TABS: 4.0000 mg | ORAL_TABLET | Freq: Two times a day (BID) | ORAL | 1 refills | Status: DC | PRN

## 2016-10-17 MED ORDER — LIDOCAINE HCL 1 % IJ SOLN
3.0000 mL | INTRAMUSCULAR | Status: AC | PRN
  Administered 2016-10-17: 3 mL

## 2016-10-17 MED ORDER — METHYLPREDNISOLONE ACETATE 40 MG/ML IJ SUSP
40.0000 mg | INTRAMUSCULAR | Status: AC | PRN
  Administered 2016-10-17: 40 mg via INTRA_ARTICULAR

## 2016-10-17 MED ORDER — HYDROCODONE-ACETAMINOPHEN 7.5-325 MG PO TABS: 1.0000 | ORAL_TABLET | Freq: Two times a day (BID) | ORAL | 0 refills | Status: DC | PRN

## 2016-10-17 NOTE — Progress Notes (Signed)
Office Visit Note   Patient: [name]           Date of Birth: 08/06/1961           MRN: [id number] Visit Date: 10/17/2016              Requested by: Geoffry Paradiseichard Aronson, MD [address] [place], [place] [id number] PCP: Minda MeoARONSON,Zailyn A, MD   Assessment & Plan: Visit Diagnoses:  1. Acute pain of right shoulder     Plan: The patient is well-known to me. He tolerated the steroid injection in his shoulder well to watch his blood glucose closely. I did give him a prescription for some hydrocodone 7.5/325 as well as Zanaflex 4 mg both take as needed. He'll continue anti-inflammatories as well as needed. We'll see him back in 3 months to see how is doing overall.  Follow-Up Instructions: Return in about 3 months (around 01/15/2017).   Orders:  No orders of the defined types were placed in this encounter.  No orders of the defined types were placed in this encounter.     Procedures: No procedures performed   Clinical Data: No additional findings.   Subjective: No chief complaint on file. The patient is very well-known to me. He has a chief complaint of right shoulder pain. This is the shoulder was seen for long time. We actually been performed orthoscopic surgery with subacromial decompression and debridement. He unfortunately a been incarcerated late last year and he was in shackles and chains for long period time and this did worsen his shoulder symptoms unfortunately. He comes in today hoping to have a steroid injection in his right shoulder. He reports that when he was in jail that his blood glucose got out of control. He says that that his blood sugars are better controlled now. He has been taking multiple Advil tablets daily and is affecting his stomach now.  HPI  Review of Systems He currently denies any chest pain, headache, shortness of breath, fever, chills, nausea, vomiting.  Objective: Vital Signs: There were no vitals taken for this visit.  Physical Exam He is  alert and oriented 3 and in no acute distress Ortho Exam Examination of his right shoulder shows a well-healed surgical incisions. He has painful range of motion of his shoulders pressure with abduction as well as external and internal rotation. There is no weakness. There is slight stiffening of the shoulder compatible with adhesive capsulitis. Specialty Comments:  No specialty comments available.  Imaging: No results found.   PMFS History: Patient Active Problem List   Diagnosis Date Noted  . Cellulitis 04/11/2015  . Morbid obesity with BMI of 50.0-59.9, adult (HCC) 04/11/2015  . Chest pain on breathing 04/11/2015  . Cellulitis of leg, right 04/11/2015  . Impingement syndrome of right shoulder 04/10/2015  . OSA (obstructive sleep apnea) 09/05/2013  . Sleep-related hypoventilation 09/05/2013  . Venous insufficiency 10/11/2011  . HYPOGONADISM 07/11/2008  . THROMBOCYTOPENIA 07/11/2008  . OTHER CHRONIC NONALCOHOLIC LIVER DISEASE 09/11/2007  . BACK PAIN, LUMBAR, CHRONIC 09/11/2007  . FOOT PAIN 09/11/2007  . DM2 (diabetes mellitus, type 2) (HCC) 05/18/2007  . GOUT 05/18/2007  . Essential hypertension 05/18/2007  . ALLERGIC RHINITIS 05/18/2007  . GERD 05/18/2007   Past Medical History:  Diagnosis Date  . Arthritis   . Diabetes mellitus age 89  . DVT (deep venous thrombosis) (HCC)   . Hyperlipidemia   . Hypertension   . Phlebitis   . Plantar fasciitis    left foot  .  Sleep apnea    wears c-pap machine  . Sleep-related hypoventilation 09/05/2013  . Venous insufficiency (chronic) (peripheral)     Family History  Problem Relation Age of Onset  . Cancer Mother     ovarian  . Hypertension Mother   . Arthritis Mother   . Diabetes Mother   . Cancer Father     colon    Past Surgical History:  Procedure Laterality Date  . APPENDECTOMY    . COLONOSCOPY N/A 09/06/2013   Procedure: COLONOSCOPY;  Surgeon: Petra Kuba, MD;  Location: [facility];  Service: Endoscopy;   Laterality: N/A;  . KNEE SURGERY     right knee arthroscopy  . NOSE SURGERY    . SHOULDER ARTHROSCOPY Right 04/10/2015   Procedure: RIGHT SHOULDER ARTHROSCOPY WITH DEBRIDEMENT, SUBACROMIAL DECOMPRESSION;  Surgeon: Kathryne Hitch, MD;  Location: [facility];  Service: Orthopedics;  Laterality: Right;  . SHOULDER SURGERY     left shoulder for labral tear   Social History   Occupational History  . Not on file.   Social History Main Topics  . Smoking status: Never Smoker  . Smokeless tobacco: Never Used  . Alcohol use No  . Drug use: No  . Sexual activity: Not on file

## 2016-10-17 NOTE — Progress Notes (Signed)
Office Visit Note   Patient: [name]           Date of Birth: 03/19/1961           MRN: [id number] Visit Date: 10/17/2016              Requested by: Geoffry Paradiseichard Aronson, MD [address] [place], [place] [address] PCP: Minda MeoARONSON,Tarrell A, MD   Assessment & Plan: Visit Diagnoses:  1. Acute pain of right shoulder     Plan:He tolerated the steroid injection well and is right shoulder. Watch his blood glucose closely to make sure this doesn't cause a flareup of his blood sugars. Again we will provide short-term muscle relaxants and pain medication for him.  Follow-Up Instructions: Return in about 3 months (around 01/15/2017).   Orders:  No orders of the defined types were placed in this encounter.  Meds ordered this encounter  Medications  . tiZANidine (ZANAFLEX) 4 MG tablet    Sig: Take 1 tablet (4 mg total) by mouth 2 (two) times daily as needed for muscle spasms.    Dispense:  60 tablet    Refill:  1  . HYDROcodone-acetaminophen (NORCO) 7.5-325 MG tablet    Sig: Take 1-2 tablets by mouth 2 (two) times daily as needed for moderate pain.    Dispense:  90 tablet    Refill:  0      Procedures: Large Joint Inj Date/Time: 10/17/2016 4:02 PM Performed by: Kathryne HitchBLACKMAN, Lyniah Fujita Y Authorized by: Kathryne HitchBLACKMAN, Kavon Valenza Y   Location:  Shoulder Site:  R subacromial bursa Ultrasound Guidance: No   Fluoroscopic Guidance: No   Arthrogram: No   Medications:  3 mL lidocaine 1 %; 40 mg methylPREDNISolone acetate 40 MG/ML     Clinical Data: No additional findings.   Subjective: No chief complaint on file.   HPI  Review of Systems   Objective: Vital Signs: There were no vitals taken for this visit.  Physical Exam  Ortho Exam  Specialty Comments:  No specialty comments available.  Imaging: No results found.   PMFS History: Patient Active Problem List   Diagnosis Date Noted  . Cellulitis 04/11/2015  . Morbid obesity with BMI of 50.0-59.9, adult (HCC)  04/11/2015  . Chest pain on breathing 04/11/2015  . Cellulitis of leg, right 04/11/2015  . Impingement syndrome of right shoulder 04/10/2015  . OSA (obstructive sleep apnea) 09/05/2013  . Sleep-related hypoventilation 09/05/2013  . Venous insufficiency 10/11/2011  . HYPOGONADISM 07/11/2008  . THROMBOCYTOPENIA 07/11/2008  . OTHER CHRONIC NONALCOHOLIC LIVER DISEASE 09/11/2007  . BACK PAIN, LUMBAR, CHRONIC 09/11/2007  . FOOT PAIN 09/11/2007  . DM2 (diabetes mellitus, type 2) (HCC) 05/18/2007  . GOUT 05/18/2007  . Essential hypertension 05/18/2007  . ALLERGIC RHINITIS 05/18/2007  . GERD 05/18/2007   Past Medical History:  Diagnosis Date  . Arthritis   . Diabetes mellitus age 56  . DVT (deep venous thrombosis) (HCC)   . Hyperlipidemia   . Hypertension   . Phlebitis   . Plantar fasciitis    left foot  . Sleep apnea    wears c-pap machine  . Sleep-related hypoventilation 09/05/2013  . Venous insufficiency (chronic) (peripheral)     Family History  Problem Relation Age of Onset  . Cancer Mother     ovarian  . Hypertension Mother   . Arthritis Mother   . Diabetes Mother   . Cancer Father     colon    Past Surgical History:  Procedure Laterality Date  . APPENDECTOMY    .  COLONOSCOPY N/A 09/06/2013   Procedure: COLONOSCOPY;  Surgeon: Petra Kuba, MD;  Location: [facility];  Service: Endoscopy;  Laterality: N/A;  . KNEE SURGERY     right knee arthroscopy  . NOSE SURGERY    . SHOULDER ARTHROSCOPY Right 04/10/2015   Procedure: RIGHT SHOULDER ARTHROSCOPY WITH DEBRIDEMENT, SUBACROMIAL DECOMPRESSION;  Surgeon: Kathryne Hitch, MD;  Location: [facility];  Service: Orthopedics;  Laterality: Right;  . SHOULDER SURGERY     left shoulder for labral tear   Social History   Occupational History  . Not on file.   Social History Main Topics  . Smoking status: Never Smoker  . Smokeless tobacco: Never Used  . Alcohol use No  . Drug use: No  . Sexual activity: Not on file

## 2016-10-18 ENCOUNTER — Encounter: Payer: Self-pay | Admitting: Physician Assistant

## 2016-10-18 ENCOUNTER — Ambulatory Visit (INDEPENDENT_AMBULATORY_CARE_PROVIDER_SITE_OTHER): Payer: Medicare Other | Admitting: Physician Assistant

## 2016-10-18 VITALS — BP 153/82 | HR 86 | Resp 18 | Ht 72.0 in | Wt 304.0 lb

## 2016-10-18 DIAGNOSIS — Z202 Contact with and (suspected) exposure to infections with a predominantly sexual mode of transmission: Secondary | ICD-10-CM

## 2016-10-18 MED ORDER — METRONIDAZOLE 500 MG PO TABS: 500.0000 mg | ORAL_TABLET | Freq: Two times a day (BID) | ORAL | 0 refills | Status: DC

## 2016-10-18 NOTE — Patient Instructions (Addendum)
   IF you received an x-ray today, you will receive an invoice from [facility]. Please contact [facility] at [phone] with questions or concerns regarding your invoice.   IF you received labwork today, you will receive an invoice from [facility]. Please contact [facility] at [phone] with questions or concerns regarding your invoice.   Our billing staff will not be able to assist you with questions regarding bills from these companies.  You will be contacted with the lab results as soon as they are available. The fastest way to get your results is to activate your My Chart account. Instructions are located on the last page of this paperwork. If you have not heard from us regarding the results in 2 weeks, please contact this office.     Trichomoniasis Trichomoniasis is an infection caused by an organism called Trichomonas. The infection can affect both women and men. In women, the outer male genitalia and the vagina are affected. In men, the penis is mainly affected, but the prostate and other reproductive organs can also be involved. Trichomoniasis is a sexually transmitted infection (STI) and is most often passed to another person through sexual contact.  RISK FACTORS  Having unprotected sexual intercourse.  Having sexual intercourse with an infected partner. SIGNS AND SYMPTOMS  Symptoms of trichomoniasis in women include:  Abnormal gray-green frothy vaginal discharge.  Itching and irritation of the vagina.  Itching and irritation of the area outside the vagina. Symptoms of trichomoniasis in men include:   Penile discharge with or without pain.  Pain during urination. This results from inflammation of the urethra. DIAGNOSIS  Trichomoniasis may be found during a Pap test or physical exam. Your health care provider may use one of the following methods to help diagnose this infection:  Testing the pH of the vagina with a test tape.  Using a vaginal swab  test that checks for the Trichomonas organism. A test is available that provides results within a few minutes.  Examining a urine sample.  Testing vaginal secretions. Your health care provider may test you for other STIs, including HIV. TREATMENT   You may be given medicine to fight the infection. Women should inform their health care provider if they could be or are pregnant. Some medicines used to treat the infection should not be taken during pregnancy.  Your health care provider may recommend over-the-counter medicines or creams to decrease itching or irritation.  Your sexual partner will need to be treated if infected.  Your health care provider may test you for infection again 3 months after treatment. HOME CARE INSTRUCTIONS   Take medicines only as directed by your health care provider.  Take over-the-counter medicine for itching or irritation as directed by your health care provider.  Do not have sexual intercourse while you have the infection.  Women should not douche or wear tampons while they have the infection.  Discuss your infection with your partner. Your partner may have gotten the infection from you, or you may have gotten it from your partner.  Have your sex partner get examined and treated if necessary.  Practice safe, informed, and protected sex.  See your health care provider for other STI testing. SEEK MEDICAL CARE IF:   You still have symptoms after you finish your medicine.  You develop abdominal pain.  You have pain when you urinate.  You have bleeding after sexual intercourse.  You develop a rash.  Your medicine makes you sick or makes you throw up (vomit). MAKE   SURE YOU:  Understand these instructions.  Will watch your condition.  Will get help right away if you are not doing well or get worse. This information is not intended to replace advice given to you by your health care provider. Make sure you discuss any questions you have with  your health care provider. Document Released: 03/01/2001 Document Revised: 09/26/2014 Document Reviewed: 06/17/2013 Elsevier Interactive Patient Education  2017 Elsevier Inc.  

## 2016-10-18 NOTE — Progress Notes (Signed)
Patient ID: [name], male    DOB: 03/01/1961, 56 y.o.   MRN: [id number]  PCP: Minda MeoARONSON,Marqui A, MD  Chief Complaint  Patient presents with  . Labs Only    STD testing     Subjective:   Presents for evaluation of exposure to trichomonas.  Recently his wife notified him that she was diagnosed with trichomonas. She presented to the ED with urinary symptoms and was found to have trich. GC/CT were negative. In notifying him, she shared that during his recent incarceration, she had sex with another man.   He denies urinary urgency, frequency and burning. No hematuria. No penile discharge. 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 Now he isn't able to ejaculate.  Also notes a rash on the chest, upper back and scalp. Worsening. Has mentioned it to his PCP who advised it could be a reaction to the Cymbalta or possibly due to having diabetes. He notes that often when he is on antibiotics, it goes away. He's also been given a lotion that helped the scalp lesions some.  The lesions look like hives to him, and have clear fluid in them. His experience is that they do not go away unless he squeezes the fluid out, then they resolve. Lesions are itchy, but not painful.   Review of Systems As above.    Patient Active Problem List   Diagnosis Date Noted  . Cellulitis 04/11/2015  . Morbid obesity with BMI of 50.0-59.9, adult (HCC) 04/11/2015  . Chest pain on breathing 04/11/2015  . Cellulitis of leg, right 04/11/2015  . Impingement syndrome of right shoulder 04/10/2015  . OSA (obstructive sleep apnea) 09/05/2013  . Sleep-related hypoventilation 09/05/2013  . Venous insufficiency 10/11/2011  . HYPOGONADISM 07/11/2008  . THROMBOCYTOPENIA 07/11/2008  . OTHER CHRONIC NONALCOHOLIC LIVER DISEASE 09/11/2007  . BACK PAIN, LUMBAR, CHRONIC 09/11/2007  . FOOT PAIN 09/11/2007  . DM2 (diabetes mellitus, type 2) (HCC) 05/18/2007  . GOUT 05/18/2007  . Essential  hypertension 05/18/2007  . ALLERGIC RHINITIS 05/18/2007  . GERD 05/18/2007    No Known Allergies  Prior to Admission medications   Medication Sig Start Date End Date Taking? Authorizing Provider  acetaminophen (TYLENOL) 500 MG tablet Take 1,500 mg by mouth 2 (two) times daily as needed for moderate pain.   Yes Historical Provider, MD  DULoxetine (CYMBALTA) 60 MG capsule Take 60 mg by mouth 2 (two) times daily.   Yes Historical Provider, MD  HYDROcodone-acetaminophen (NORCO) 7.5-325 MG tablet Take 1-2 tablets by mouth 2 (two) times daily as needed for moderate pain. 10/17/16  Yes Kathryne Hitchhristopher Y Blackman, MD  ibuprofen (ADVIL,MOTRIN) 200 MG tablet Take 400 mg by mouth 2 (two) times daily as needed for mild pain or moderate pain.    Yes Historical Provider, MD  insulin glargine (LANTUS) 100 UNIT/ML injection Inject 50 Units into the skin 2 (two) times daily.    Yes Historical Provider, MD  insulin lispro (HUMALOG KWIKPEN) 100 UNIT/ML KiwkPen Inject 40-200 Units into the skin 3 (three) times daily.    Yes Historical Provider, MD  metroNIDAZOLE (FLAGYL) 500 MG tablet Take 1 tablet (500 mg total) by mouth 2 (two) times daily with a meal. DO NOT CONSUME ALCOHOL WHILE TAKING THIS MEDICATION. 10/18/16   Cisco Kindt, PA-C  tiZANidine (ZANAFLEX) 4 MG tablet Take 1 tablet (4 mg total) by mouth 2 (two) times daily as needed for muscle spasms. Patient not taking: Reported on 10/18/2016 10/17/16   Kathryne Hitchhristopher Y Blackman,  MD     Past Medical, Surgical Family and Social History reviewed and updated.        Objective:  Physical Exam  Constitutional: He is oriented to person, place, and time. He appears well-developed and well-nourished. He is active and cooperative. No distress.  BP (!) 153/82   Pulse 86   Resp 18   Ht 6' (1.829 m)   Wt (!) 304 lb (137.9 kg)   SpO2 96%   BMI 41.23 kg/m    Eyes: Conjunctivae are normal.  Pulmonary/Chest: Effort normal.  Neurological: He is alert and oriented to  person, place, and time.  Skin: Skin is warm and dry. Lesion (multiple scattered excoriated lesions in various stages of healing on the upper anterior chest, upper back and scalp. No edema, induration or drainage.) noted.  Psychiatric: He has a normal mood and affect. His speech is normal and behavior is normal.      Assessment & Plan:  1. Exposure to trichomonas Anticipate normal urine microscopy. If he develops any urinary symptoms or penile D/C, would test for GC/CT, though partner tested negative. - Urine Microscopic - metroNIDAZOLE (FLAGYL) 500 MG tablet; Take 1 tablet (500 mg total) by mouth 2 (two) times daily with a meal. DO NOT CONSUME ALCOHOL WHILE TAKING THIS MEDICATION.  Dispense: 14 tablet; Refill: 0   Fernande Bras, PA-C Physician Assistant-Certified Primary Care at [facility]

## 2016-10-18 NOTE — Progress Notes (Signed)
Patient ID: [name], male     DOB: 04/26/1961, 56 y.o.    MRN: [id number]  PCP: Minda MeoARONSON,Kazmir A, MD  Chief Complaint  Patient presents with  . Labs Only    STD testing     Subjective:   This patient is new to [employer] Jeff[employer], PA-C and presents for evaluation of STD testing.  Pt is a 56 yo caucasian male who presents for STD testing. He states that, while he was recently imprisoned, his wife had an extra-marital affair and was later treated for Trichomonas. His wife was also tested for, but negative for GC/Chylmidia. Today, he presents for testing and treatment for trichomonas. He denies fever, chills, fatigue, dysuria, itching, discharge, rash, or hematuria. He denies any other sexual partners other than his wife.  Review of Systems See HPI  Prior to Admission medications   Medication Sig Start Date End Date Taking? Authorizing Olvin Rohr  acetaminophen (TYLENOL) 500 MG tablet Take 1,500 mg by mouth 2 (two) times daily as needed for moderate pain.   Yes Historical Carroll Ranney, MD  DULoxetine (CYMBALTA) 60 MG capsule Take 60 mg by mouth 2 (two) times daily.   Yes Historical Breeanna Galgano, MD  HYDROcodone-acetaminophen (NORCO) 7.5-325 MG tablet Take 1-2 tablets by mouth 2 (two) times daily as needed for moderate pain. 10/17/16  Yes Kathryne Hitchhristopher Y Blackman, MD  ibuprofen (ADVIL,MOTRIN) 200 MG tablet Take 400 mg by mouth 2 (two) times daily as needed for mild pain or moderate pain.    Yes Historical Anina Schnake, MD  insulin glargine (LANTUS) 100 UNIT/ML injection Inject 50 Units into the skin 2 (two) times daily.    Yes Historical Lisandra Mathisen, MD  insulin lispro (HUMALOG KWIKPEN) 100 UNIT/ML KiwkPen Inject 40-200 Units into the skin 3 (three) times daily.    Yes Historical Cosme Jacob, MD  tiZANidine (ZANAFLEX) 4 MG tablet Take 1 tablet (4 mg total) by mouth 2 (two) times daily as needed for muscle spasms. Patient not taking: Reported on 10/18/2016 10/17/16   Kathryne Hitchhristopher Y Blackman, MD     No  Known Allergies   Patient Active Problem List   Diagnosis Date Noted  . Cellulitis 04/11/2015  . Morbid obesity with BMI of 50.0-59.9, adult (HCC) 04/11/2015  . Chest pain on breathing 04/11/2015  . Cellulitis of leg, right 04/11/2015  . Impingement syndrome of right shoulder 04/10/2015  . OSA (obstructive sleep apnea) 09/05/2013  . Sleep-related hypoventilation 09/05/2013  . Venous insufficiency 10/11/2011  . HYPOGONADISM 07/11/2008  . THROMBOCYTOPENIA 07/11/2008  . OTHER CHRONIC NONALCOHOLIC LIVER DISEASE 09/11/2007  . BACK PAIN, LUMBAR, CHRONIC 09/11/2007  . FOOT PAIN 09/11/2007  . DM2 (diabetes mellitus, type 2) (HCC) 05/18/2007  . GOUT 05/18/2007  . Essential hypertension 05/18/2007  . ALLERGIC RHINITIS 05/18/2007  . GERD 05/18/2007     Family History  Problem Relation Age of Onset  . Cancer Mother     ovarian  . Hypertension Mother   . Arthritis Mother   . Diabetes Mother   . Cancer Father     colon     Social History   Social History  . Marital status: Married    Spouse name: N/A  . Number of children: N/A  . Years of education: N/A   Occupational History  . Not on file.   Social History Main Topics  . Smoking status: Never Smoker  . Smokeless tobacco: Never Used  . Alcohol use No  . Drug use: No  . Sexual activity: Not on file  Other Topics Concern  . Not on file   Social History Narrative  . No narrative on file         Objective:  Physical Exam Pulm: Good respiratory effort. CTAB. No wheezes, rales, or rhonchi, CV: RRR. No M/R/G. Abd: Soft, non tender, non distended.       Assessment & Plan:  1. Exposure to trichomonas Will treat empirically for trichomonas. Pt advised to avoid consuming alcohol while on this medicine and to avoid sexual intercourse until after treatment completion. Pt advised to follow up with his primary care or here for retesting in 3 months.  - Urine Microscopic - metroNIDAZOLE (FLAGYL) 500 MG tablet; Take 1  tablet (500 mg total) by mouth 2 (two) times daily with a meal. DO NOT CONSUME ALCOHOL WHILE TAKING THIS MEDICATION.  Dispense: 14 tablet; Refill: 0  Georgiana Spinner, PA-S

## 2016-10-19 LAB — URINALYSIS, MICROSCOPIC ONLY
CASTS: NONE SEEN /LPF
Epithelial Cells (non renal): NONE SEEN /hpf (ref 0–10)
RBC, UA: NONE SEEN /hpf (ref 0–?)

## 2017-01-16 ENCOUNTER — Ambulatory Visit (INDEPENDENT_AMBULATORY_CARE_PROVIDER_SITE_OTHER): Payer: Medicare Other | Admitting: Orthopaedic Surgery

## 2017-05-31 ENCOUNTER — Ambulatory Visit (INDEPENDENT_AMBULATORY_CARE_PROVIDER_SITE_OTHER): Payer: Medicare Other | Admitting: Orthopaedic Surgery

## 2017-05-31 DIAGNOSIS — M25561 Pain in right knee: Secondary | ICD-10-CM | POA: Diagnosis not present

## 2017-05-31 DIAGNOSIS — M25511 Pain in right shoulder: Secondary | ICD-10-CM

## 2017-05-31 DIAGNOSIS — G8929 Other chronic pain: Secondary | ICD-10-CM | POA: Diagnosis not present

## 2017-05-31 DIAGNOSIS — M7541 Impingement syndrome of right shoulder: Secondary | ICD-10-CM

## 2017-05-31 MED ORDER — METHYLPREDNISOLONE ACETATE 40 MG/ML IJ SUSP
40.0000 mg | INTRAMUSCULAR | Status: AC | PRN
  Administered 2017-05-31: 40 mg via INTRA_ARTICULAR

## 2017-05-31 MED ORDER — DICLOFENAC SODIUM 1 % TD GEL: 4.0000 g | Freq: Three times a day (TID) | TRANSDERMAL | 6 refills | Status: DC | PRN

## 2017-05-31 MED ORDER — LIDOCAINE HCL 1 % IJ SOLN
3.0000 mL | INTRAMUSCULAR | Status: AC | PRN
  Administered 2017-05-31: 3 mL

## 2017-05-31 MED ORDER — HYDROCODONE-ACETAMINOPHEN 7.5-325 MG PO TABS: 1.0000 | ORAL_TABLET | Freq: Two times a day (BID) | ORAL | 0 refills | Status: DC | PRN

## 2017-05-31 NOTE — Progress Notes (Signed)
Office Visit Note   Patient: [name]           Date of Birth: 06/16/1961           MRN: [id number] Visit Date: 05/31/2017              Requested by: Geoffry ParadiseAronson, Berk, MD [address] [place], [place] [address] PCP: Geoffry ParadiseAronson, Onie, MD   Assessment & Plan: Visit Diagnoses:  1. Chronic pain of right knee   2. Chronic right shoulder pain   3. Impingement syndrome of right shoulder     Plan: I certainly feel that trying steroid injections again in his right shoulder and right knee are warranted. He understands this in detail. I explained the risks and benefits of this to him as well. He tolerated the injections well. We'll start try to send and Voltaren gel for him as well. I did refill his hydrocodone. We'll see him back in about 3 months to see if he is doing better overall.  Follow-Up Instructions: Return in about 3 months (around 08/30/2017).   Orders:  Orders Placed This Encounter  Procedures  . Large Joint Injection/Arthrocentesis  . Large Joint Injection/Arthrocentesis   Meds ordered this encounter  Medications  . diclofenac sodium (VOLTAREN) 1 % GEL    Sig: Apply 4 g topically 3 (three) times daily as needed.    Dispense:  3 Tube    Refill:  6  . HYDROcodone-acetaminophen (NORCO) 7.5-325 MG tablet    Sig: Take 1-2 tablets by mouth 2 (two) times daily as needed for moderate pain.    Dispense:  90 tablet    Refill:  0      Procedures: Large Joint Inj Date/Time: 05/31/2017 11:12 AM Performed by: Kathryne HitchBLACKMAN, CHRISTOPHER Y Authorized by: Doneen PoissonBLACKMAN, CHRISTOPHER Y   Location:  Knee Site:  R knee Ultrasound Guidance: No   Fluoroscopic Guidance: No   Arthrogram: No   Medications:  3 mL lidocaine 1 %; 40 mg methylPREDNISolone acetate 40 MG/ML Large Joint Inj Date/Time: 05/31/2017 11:12 AM Performed by: Kathryne HitchBLACKMAN, CHRISTOPHER Y Authorized by: Kathryne HitchBLACKMAN, CHRISTOPHER Y   Location:  Shoulder Site:  R subacromial bursa Ultrasound Guidance: No   Fluoroscopic  Guidance: No   Arthrogram: No   Medications:  3 mL lidocaine 1 %; 40 mg methylPREDNISolone acetate 40 MG/ML     Clinical Data: No additional findings.   Subjective: No chief complaint on file. The patient is well-known to me. He comes in with chief complaint of right shoulder pain in right knee pain. He last had a steroid injection that shoulder about 8 months ago or so. He like to have a steroid injection in the right shoulder. He's a remote arthroscopic surgery on that shoulder. He is also been having some right knee pain and is taking his hands overall. He does have a tube of Voltaren gel that a friend had given him and that is helped to like to have a prescription for. His pain is mainly with activities with the shoulder it's reaching overhead and with his knee and she is walking in general. He is someone that is obese. Injections of helped in the past. He's also taken some chronic hydrocodone at times would like to have a refill this today. Is been 8 months since last refills agreeable to this.  HPI  Review of Systems He currently denies any headache, chest pain, nausea, vomiting, fever, chills, shortness of breath  Objective: Vital Signs: There were no vitals taken for this visit.  Physical Exam He is alert and 3 in no acute distress Ortho Exam Examination of his right shoulder shows pain around subacromial subdeltoid outlets. He has biceps tendinitis as well as a proximal biceps tendon. He's got decent strength in the shoulder. Examination of the knee shows no effusion but varus malalignment and medial joint line tenderness and posterior medial pain as well. The knee feels ligamentously stable. Specialty Comments:  No specialty comments available.  Imaging: No results found.   PMFS History: Patient Active Problem List   Diagnosis Date Noted  . Chronic pain of right knee 05/31/2017  . Chronic right shoulder pain 05/31/2017  . Cellulitis 04/11/2015  . Morbid obesity with  BMI of 50.0-59.9, adult (HCC) 04/11/2015  . Chest pain on breathing 04/11/2015  . Cellulitis of leg, right 04/11/2015  . Impingement syndrome of right shoulder 04/10/2015  . OSA (obstructive sleep apnea) 09/05/2013  . Sleep-related hypoventilation 09/05/2013  . Venous insufficiency 10/11/2011  . HYPOGONADISM 07/11/2008  . THROMBOCYTOPENIA 07/11/2008  . OTHER CHRONIC NONALCOHOLIC LIVER DISEASE 09/11/2007  . BACK PAIN, LUMBAR, CHRONIC 09/11/2007  . FOOT PAIN 09/11/2007  . DM2 (diabetes mellitus, type 2) (HCC) 05/18/2007  . GOUT 05/18/2007  . Essential hypertension 05/18/2007  . ALLERGIC RHINITIS 05/18/2007  . GERD 05/18/2007   Past Medical History:  Diagnosis Date  . Arthritis   . Diabetes mellitus age 72  . DVT (deep venous thrombosis) (HCC)   . Hyperlipidemia   . Hypertension   . Phlebitis   . Plantar fasciitis    left foot  . Sleep apnea    wears c-pap machine  . Sleep-related hypoventilation 09/05/2013  . Venous insufficiency (chronic) (peripheral)     Family History  Problem Relation Age of Onset  . Cancer Mother        ovarian  . Hypertension Mother   . Arthritis Mother   . Diabetes Mother   . Cancer Father        colon    Past Surgical History:  Procedure Laterality Date  . APPENDECTOMY    . COLONOSCOPY N/A 09/06/2013   Procedure: COLONOSCOPY;  Surgeon: Petra Kuba, MD;  Location: [facility];  Service: Endoscopy;  Laterality: N/A;  . KNEE SURGERY     right knee arthroscopy  . NOSE SURGERY    . SHOULDER ARTHROSCOPY Right 04/10/2015   Procedure: RIGHT SHOULDER ARTHROSCOPY WITH DEBRIDEMENT, SUBACROMIAL DECOMPRESSION;  Surgeon: Kathryne Hitch, MD;  Location: [facility];  Service: Orthopedics;  Laterality: Right;  . SHOULDER SURGERY     left shoulder for labral tear   Social History   Occupational History  . Not on file.   Social History Main Topics  . Smoking status: Never Smoker  . Smokeless tobacco: Never Used  . Alcohol use No  . Drug use: No   . Sexual activity: Not on file

## 2017-07-17 ENCOUNTER — Other Ambulatory Visit: Payer: Self-pay | Admitting: Internal Medicine

## 2017-07-17 DIAGNOSIS — R310 Gross hematuria: Secondary | ICD-10-CM

## 2017-07-18 ENCOUNTER — Other Ambulatory Visit: Payer: Medicare Other

## 2017-08-07 ENCOUNTER — Other Ambulatory Visit: Payer: Self-pay

## 2017-08-07 ENCOUNTER — Observation Stay (HOSPITAL_COMMUNITY)
Admission: EM | Admit: 2017-08-07 | Discharge: 2017-08-08 | Disposition: A | Discharge status: Home / Self Care | Payer: Medicare Other | Attending: Internal Medicine | Admitting: Internal Medicine

## 2017-08-07 DIAGNOSIS — E11649 Type 2 diabetes mellitus with hypoglycemia without coma: Secondary | ICD-10-CM | POA: Diagnosis not present

## 2017-08-07 DIAGNOSIS — E118 Type 2 diabetes mellitus with unspecified complications: Secondary | ICD-10-CM | POA: Diagnosis not present

## 2017-08-07 DIAGNOSIS — I10 Essential (primary) hypertension: Secondary | ICD-10-CM | POA: Diagnosis present

## 2017-08-07 DIAGNOSIS — K219 Gastro-esophageal reflux disease without esophagitis: Secondary | ICD-10-CM | POA: Insufficient documentation

## 2017-08-07 DIAGNOSIS — Z9989 Dependence on other enabling machines and devices: Secondary | ICD-10-CM | POA: Insufficient documentation

## 2017-08-07 DIAGNOSIS — E162 Hypoglycemia, unspecified: Secondary | ICD-10-CM | POA: Diagnosis present

## 2017-08-07 DIAGNOSIS — E785 Hyperlipidemia, unspecified: Secondary | ICD-10-CM | POA: Insufficient documentation

## 2017-08-07 DIAGNOSIS — R0602 Shortness of breath: Secondary | ICD-10-CM

## 2017-08-07 DIAGNOSIS — R008 Other abnormalities of heart beat: Secondary | ICD-10-CM | POA: Insufficient documentation

## 2017-08-07 DIAGNOSIS — Z6835 Body mass index (BMI) 35.0-35.9, adult: Secondary | ICD-10-CM | POA: Insufficient documentation

## 2017-08-07 DIAGNOSIS — M109 Gout, unspecified: Secondary | ICD-10-CM | POA: Diagnosis not present

## 2017-08-07 DIAGNOSIS — G4736 Sleep related hypoventilation in conditions classified elsewhere: Secondary | ICD-10-CM | POA: Diagnosis not present

## 2017-08-07 DIAGNOSIS — D72829 Elevated white blood cell count, unspecified: Secondary | ICD-10-CM | POA: Diagnosis present

## 2017-08-07 DIAGNOSIS — R404 Transient alteration of awareness: Secondary | ICD-10-CM

## 2017-08-07 DIAGNOSIS — Z794 Long term (current) use of insulin: Secondary | ICD-10-CM

## 2017-08-07 DIAGNOSIS — Z79899 Other long term (current) drug therapy: Secondary | ICD-10-CM | POA: Diagnosis not present

## 2017-08-07 DIAGNOSIS — Z86718 Personal history of other venous thrombosis and embolism: Secondary | ICD-10-CM | POA: Insufficient documentation

## 2017-08-07 DIAGNOSIS — G8929 Other chronic pain: Secondary | ICD-10-CM | POA: Diagnosis not present

## 2017-08-07 DIAGNOSIS — G4733 Obstructive sleep apnea (adult) (pediatric): Secondary | ICD-10-CM | POA: Insufficient documentation

## 2017-08-07 DIAGNOSIS — E876 Hypokalemia: Secondary | ICD-10-CM | POA: Diagnosis present

## 2017-08-07 DIAGNOSIS — G9340 Encephalopathy, unspecified: Secondary | ICD-10-CM | POA: Diagnosis not present

## 2017-08-07 DIAGNOSIS — I4510 Unspecified right bundle-branch block: Secondary | ICD-10-CM | POA: Diagnosis not present

## 2017-08-07 DIAGNOSIS — E119 Type 2 diabetes mellitus without complications: Secondary | ICD-10-CM

## 2017-08-07 DIAGNOSIS — M1990 Unspecified osteoarthritis, unspecified site: Secondary | ICD-10-CM | POA: Insufficient documentation

## 2017-08-07 DIAGNOSIS — I872 Venous insufficiency (chronic) (peripheral): Secondary | ICD-10-CM | POA: Diagnosis not present

## 2017-08-07 LAB — COMPREHENSIVE METABOLIC PANEL
ALK PHOS: 73 U/L (ref 38–126)
ALT: 34 U/L (ref 17–63)
ANION GAP: 10 (ref 5–15)
AST: 29 U/L (ref 15–41)
Albumin: 4 g/dL (ref 3.5–5.0)
BILIRUBIN TOTAL: 0.5 mg/dL (ref 0.3–1.2)
BUN: 13 mg/dL (ref 6–20)
CALCIUM: 9.2 mg/dL (ref 8.9–10.3)
CO2: 25 mmol/L (ref 22–32)
CREATININE: 1.06 mg/dL (ref 0.61–1.24)
Chloride: 106 mmol/L (ref 101–111)
GFR calc non Af Amer: >60 mL/min (ref >60)
Glucose, Bld: 24 mg/dL — CL (ref 65–99)
Potassium: 2.7 mmol/L — CL (ref 3.5–5.1)
Sodium: 141 mmol/L (ref 135–145)
TOTAL PROTEIN: 7.8 g/dL (ref 6.5–8.1)

## 2017-08-07 LAB — TSH: TSH: 3.769 u[IU]/mL (ref 0.350–4.500)

## 2017-08-07 LAB — CBC
HCT: 46.5 % (ref 39.0–52.0)
HEMOGLOBIN: 15.9 g/dL (ref 13.0–17.0)
MCH: 32.2 pg (ref 26.0–34.0)
MCHC: 34.2 g/dL (ref 30.0–36.0)
MCV: 94.1 fL (ref 78.0–100.0)
PLATELETS: 229 10*3/uL (ref 150–400)
RBC: 4.94 MIL/uL (ref 4.22–5.81)
RDW: 13.2 % (ref 11.5–15.5)
WBC: 12.3 10*3/uL — ABNORMAL HIGH (ref 4.0–10.5)

## 2017-08-07 LAB — AMMONIA: AMMONIA: 27 umol/L (ref 9–35)

## 2017-08-07 LAB — CBG MONITORING, ED
GLUCOSE-CAPILLARY: 22 mg/dL — AB (ref 65–99)
GLUCOSE-CAPILLARY: 44 mg/dL — AB (ref 65–99)
GLUCOSE-CAPILLARY: 16 mg/dL — AB (ref 65–99)
Glucose-Capillary: 118 mg/dL — ABNORMAL HIGH (ref 65–99)
Glucose-Capillary: 22 mg/dL — CL (ref 65–99)
Glucose-Capillary: 92 mg/dL (ref 65–99)

## 2017-08-07 MED ORDER — ACETAMINOPHEN 650 MG RE SUPP: 650.0000 mg | Freq: Four times a day (QID) | RECTAL | Status: DC | PRN

## 2017-08-07 MED ORDER — POTASSIUM CHLORIDE 20 MEQ PO PACK
40.0000 meq | PACK | Freq: Once | ORAL | Status: AC
  Administered 2017-08-07: 40 meq via ORAL
  Filled 2017-08-07: qty 2

## 2017-08-07 MED ORDER — DEXTROSE 50 % IV SOLN
INTRAVENOUS | Status: AC
  Administered 2017-08-07: 50 mL
  Filled 2017-08-07: qty 50

## 2017-08-07 MED ORDER — DEXTROSE 50 % IV SOLN
INTRAVENOUS | Status: AC
  Administered 2017-08-08: 25 mL
  Filled 2017-08-07: qty 50

## 2017-08-07 MED ORDER — DEXTROSE IN LACTATED RINGERS 5 % IV SOLN: INTRAVENOUS | Status: DC

## 2017-08-07 MED ORDER — ENOXAPARIN SODIUM 60 MG/0.6ML ~~LOC~~ SOLN
60.0000 mg | SUBCUTANEOUS | Status: DC
  Filled 2017-08-07: qty 0.6

## 2017-08-07 MED ORDER — ONDANSETRON HCL 4 MG PO TABS: 4.0000 mg | ORAL_TABLET | Freq: Four times a day (QID) | ORAL | Status: DC | PRN

## 2017-08-07 MED ORDER — KCL IN DEXTROSE-NACL 20-5-0.45 MEQ/L-%-% IV SOLN
Freq: Once | INTRAVENOUS | Status: AC
  Administered 2017-08-07: 23:00:00 via INTRAVENOUS
  Filled 2017-08-07: qty 1000

## 2017-08-07 MED ORDER — ONDANSETRON HCL 4 MG/2ML IJ SOLN
4.0000 mg | Freq: Four times a day (QID) | INTRAMUSCULAR | Status: DC | PRN
  Administered 2017-08-08: 4 mg via INTRAVENOUS
  Filled 2017-08-07: qty 2

## 2017-08-07 MED ORDER — ACETAMINOPHEN 325 MG PO TABS
650.0000 mg | ORAL_TABLET | Freq: Four times a day (QID) | ORAL | Status: DC | PRN
Start: 2017-08-07 — End: 2017-08-08
  Filled 2017-08-07: qty 2

## 2017-08-07 NOTE — ED Notes (Signed)
FSBS 16. MD aware. Son at bedside. VSS. Will continue to monitor.

## 2017-08-07 NOTE — ED Provider Notes (Signed)
[facility]ENC[facility] Provider Note   CSN: [id number] Arrival date & time: 08/07/17  1932     History   Chief Complaint Chief Complaint  Patient presents with  . Altered Mental Status    possible drug overdose  . Hypoglycemia    HPI [name] is a 56 y.o. male.  HPI 56yoM with hx of DM, HLD, HTN presenting with AMS. Per the family,he was found in his vehicle confused and diaphoretic at 545.  Did not complain of any pain.  The son states that he spoke with him at noon and he was interacting appropriately.  The son states that he oftentimes has "trouble" with his insulin and has given himself too much before.  He also takes narcotics and the son is concerned that he became hypoglycemic and could have taken other medicines but is unsure.  Patient is not answering questions therefore no further history obtained at this time.  Past Medical History:  Diagnosis Date  . Arthritis   . Diabetes mellitus age 56  . DVT (deep venous thrombosis) (HCC)   . Hyperlipidemia   . Hypertension   . Phlebitis   . Plantar fasciitis    left foot  . Sleep apnea    wears c-pap machine  . Sleep-related hypoventilation 09/05/2013  . Venous insufficiency (chronic) (peripheral)     Patient Active Problem List   Diagnosis Date Noted  . Chronic pain of right knee 05/31/2017  . Chronic right shoulder pain 05/31/2017  . Cellulitis 04/11/2015  . Morbid obesity with BMI of 50.0-59.9, adult (HCC) 04/11/2015  . Chest pain on breathing 04/11/2015  . Cellulitis of leg, right 04/11/2015  . Impingement syndrome of right shoulder 04/10/2015  . OSA (obstructive sleep apnea) 09/05/2013  . Sleep-related hypoventilation 09/05/2013  . Venous insufficiency 10/11/2011  . HYPOGONADISM 07/11/2008  . THROMBOCYTOPENIA 07/11/2008  . OTHER CHRONIC NONALCOHOLIC LIVER DISEASE 09/11/2007  . BACK PAIN, LUMBAR, CHRONIC 09/11/2007  . FOOT PAIN 09/11/2007  . DM2 (diabetes mellitus, type  2) (HCC) 05/18/2007  . GOUT 05/18/2007  . Essential hypertension 05/18/2007  . ALLERGIC RHINITIS 05/18/2007  . GERD 05/18/2007    Past Surgical History:  Procedure Laterality Date  . APPENDECTOMY    . COLONOSCOPY N/A 09/06/2013   Per[facility] Petra KubaMagod, [facility] E[facility]  . KNEE SURGERY     right knee arthroscopy  . NOSE SURGERY    . RIGHT SHOULDER ARTHROSCOPY WITH DEBRIDEMENT, SUBACROMIAL DECOMPRESSION Right 04/10/2015   Per[facility] by Kathryne HitchBlackman, Christopher Y[facility]  . SHOULDER SURGERY     left shoulder for labral tear       Home Medications    Prior to Admission medications   Medication Sig Start Date End Date Taking? Authorizing Provider  acetaminophen (TYLENOL) 500 MG tablet Take 1,500 mg by mouth 2 (two) times daily as needed for moderate pain.   Yes [provider]  DULoxetine (CYMBALTA) 60 MG capsule Take 60 mg by mouth 2 (two) times daily.   Yes [provider]  HYDROcodone-acetaminophen (NORCO) 7.5-325 MG tablet Take 1-2 tablets by mouth 2 (two) times daily as needed for moderate pain. 05/31/17  Yes Kathryne HitchBlackman, Christopher Y, MD  ibuprofen (ADVIL,MOTRIN) 200 MG tablet Take 400 mg by mouth 2 (two) times daily as needed for mild pain or moderate pain.    Yes [provider]  insulin glargine (LANTUS) 100 UNIT/ML injection Inject 50 Units into the skin 2 (two) times daily.  Yes [provider]  insulin lispro (HUMALOG KWIKPEN) 100 UNIT/ML KiwkPen Inject 40-200 Units into the skin 3 (three) times daily.    Yes [provider]  metroNIDAZOLE (FLAGYL) 500 MG tablet Take 1 tablet (500 mg total) by mouth 2 (two) times daily with a meal. DO NOT CONSUME ALCOHOL WHILE TAKING THIS MEDICATION. 10/18/16  Yes Jeffery, Chelle, PA-C  diclofenac sodium (VOLTAREN) 1 % GEL Apply 4 g topically 3 (three) times daily as needed. Patient not taking: Reported on 08/07/2017 05/31/17   Kathryne HitchBlackman, Christopher Y, MD  tiZANidine (ZANAFLEX) 4 MG tablet  Take 1 tablet (4 mg total) by mouth 2 (two) times daily as needed for muscle spasms. Patient not taking: Reported on 10/18/2016 10/17/16   Kathryne HitchBlackman, Christopher Y, MD    Family History Family History  Problem Relation Age of Onset  . Cancer Mother        ovarian  . Hypertension Mother   . Arthritis Mother   . Diabetes Mother   . Cancer Father        colon    Social History Social History   Tobacco Use  . Smoking status: Never Smoker  . Smokeless tobacco: Never Used  Substance Use Topics  . Alcohol use: No  . Drug use: No     Allergies   Patient has no known allergies.   Review of Systems Review of Systems  Unable to perform ROS: Mental status change     Physical Exam Updated Vital Signs BP 136/66 (BP Location: Right Arm)   Pulse 93   Temp (!) 97.5 F (36.4 C) (Oral)   Resp (!) 22   Ht 6' (1.829 m)   Wt 117.9 kg (260 lb)   SpO2 96%   BMI 35.26 kg/m   Physical Exam  Constitutional: He appears well-developed.  HENT:  Head: Normocephalic and atraumatic.  Eyes: Conjunctivae and EOM are normal. Pupils are equal, round, and reactive to light.  Neck: Normal range of motion. Neck supple.  Cardiovascular: Normal rate, regular rhythm, normal heart sounds and intact distal pulses.  No murmur heard. Pulmonary/Chest: Effort normal and breath sounds normal. No respiratory distress. He has no wheezes. He has no rales.  Abdominal: Soft. He exhibits no distension. There is no tenderness.  Musculoskeletal: He exhibits no tenderness or deformity.  Neurological: He is alert. GCS eye subscore is 4. GCS verbal subscore is 1. GCS motor subscore is 6.  Eyes fixated forward but will move eyes on command. Follows all commands other than answering question. Pt will not talk or make noises at all. Equal grip strength b/l. B/l LE strength equal.   Skin: Skin is warm. Capillary refill takes less than 2 seconds. He is diaphoretic.  Nursing note and vitals reviewed.    ED Treatments  / Results  Labs (all labs ordered are listed, but only abnormal results are displayed) Labs Reviewed  COMPREHENSIVE METABOLIC PANEL - Abnormal; Notable for the following components:      Result Value   Potassium 2.7 (*)    Glucose, Bld 24 (*)    All other components within normal limits  CBC - Abnormal; Notable for the following components:   WBC 12.3 (*)    All other components within normal limits  CBG MONITORING, ED - Abnormal; Notable for the following components:   Glucose-Capillary 118 (*)    All other components within normal limits  CBG MONITORING, ED - Abnormal; Notable for the following components:   Glucose-Capillary 22 (*)  All other components within normal limits  CBG MONITORING, ED - Abnormal; Notable for the following components:   Glucose-Capillary 22 (*)    All other components within normal limits  CBG MONITORING, ED - Abnormal; Notable for the following components:   Glucose-Capillary 44 (*)    All other components within normal limits  CBG MONITORING, ED - Abnormal; Notable for the following components:   Glucose-Capillary 16 (*)    All other components within normal limits  AMMONIA  TSH    EKG  EKG Interpretation  Date/Time:  Monday August 07 2017 21:46:44 EST Ventricular Rate:  93 PR Interval:    QRS Duration: 124 QT Interval:  396 QTC Calculation: 493 R Axis:   43 Text Interpretation:  Sinus rhythm Ventricular trigeminy IVCD, consider atypical RBBB No STEMI.  Confirmed by Alona Bene [phone]) on 08/07/2017 9:50:17 PM       Radiology No results found.  Procedures Procedures (including critical care time)  Medications Ordered in ED Medications  dextrose 50 % solution (not administered)  potassium chloride (KLOR-CON) packet 40 mEq (not administered)  dextrose 5 % and 0.45 % NaCl with KCl 20 mEq/L infusion (not administered)  dextrose 50 % solution (50 mLs  Given 08/07/17 2147)  dextrose 50 % solution (50 mLs  Given 08/07/17 2251)    dextrose 50 % solution (50 mLs  Given 08/07/17 2251)     Initial Impression / Assessment and Plan / ED Course  I have reviewed the triage vital signs and the nursing notes.  Pertinent labs & imaging results that were available during my care of the patient were reviewed by me and considered in my medical decision making (see chart for details).     56yoM with above hx presenting with hypoglycemia and AMS. Glucose 22 on arrival. D50 given. ABCs intact. HDS and afebrile. Pt following commands and participating with neuro exam, but refuses to answer questions. Per fam, has taken too much insulin the past. Will get CBC, CMP, TSH, Ammonia.  As he is hemodynamically stable and following commands, will hold on neuroimaging. After D50, glucose 118.  Shortly after the D50, glucose back to 22. D50 given again.  After this dose of D50 the patient became more alert and oriented and is now answering questions appropriately.  He states that he often times has difficulty controlling his sugars and will give himself multiple doses of insulin to correct his hyperglycemia.  He does not remember the event.  He cannot remember how high his glucose was and cannot confirm whether he took lispro or his Lantus.  As he is now alert and oriented with a normal neurologic exam, no neuro imaging indicated.  Labs notable for a slight leukocytosis, normal TSH, normal ammonia and a potassium of 2.7.  While speaking with the patient his glucose dropped to 44 and then dropped to 16.  His repetitive dropping of glucose and hypokalemia supports an overdose of insulin.  Patient will be given potassium and start on a D5 LR drip.  Patient denies suicidal ideation and states that he did not overdose on insulin on purpose.  As I cannot confirm whether he took his Lantus or his lispro and he is became hypoglycemic 3 times in the emergency department and is hypokalemic, will admit to the hospitalist for observation. Pt amenable to this  plan.  Final Clinical Impressions(s) / ED Diagnoses   Final diagnoses:  Hypoglycemia  Transient alteration of awareness    ED Discharge Orders  None       Rebel Laughridge Italy, MD 08/07/17 [id number]    Maia Plan, MD 08/08/17 [phone]

## 2017-08-07 NOTE — ED Notes (Signed)
Patient denies pain and is resting comfortably.  

## 2017-08-07 NOTE — H&P (Addendum)
History and Physical    [name] JXB:[id number] DOB: 1961/06/25 DOA: 08/07/2017  Referring MD/NP/PA: Dr. Harrold Donath Page (resident) PCP: Geoffry Paradise, MD  Patient coming from: home  Chief Complaint: Altered  I have personally briefly reviewed patient's old medical records in [facility]  HPI: [name] is a 56 y.o. male with medical history significant of HTN, GERD, DM type II, OSA on CPAP, suicidal ideation in 05/2016; who presents after being found altered and diaphoretic by his family members a this afternoon, and brought to the emergency department.   Patient has had diabetes for 10-15 years now and normally utilizes selectable dose insulin pens for which he self adjusts his dosage.  Previous adjustments made by his doctor had caused issues.  Patient does not recall how much insulin he gave himself.  He is on narcotics as well and there was some concern that he could have taken other medications as well.  Patient actively denies any suicidal ideation or thoughts of hurting himself.  Patient was also noted to become tearful during questioning with family out of the room and noted to complain of having issues with his wife.   ED Course: Upon admission into the emergency department patient was noted to be afebrile, pulse 93-94, respirations 22, blood pressure 136/66, and O2 saturations maintained on room air.  Labs revealed WBC 12.3, potassium 2.7, glucose 24.  Patient was given multiple amps of D50 while in the emergency department with blood glucose is continuing to remain low.  He was started on a D5 0.45% IV fluids at 75 mL/h and given 40 mEq of potassium chloride.  Patient had been initially noted to be more lethargic and not responsive, but following initial treatments become more alert.  TRH called to admit.   Review of Systems  Constitutional: Positive for diaphoresis. Negative for chills and fever.       Positive for lethargy  HENT: Negative for ear discharge and  nosebleeds.   Eyes: Negative for photophobia and pain.  Respiratory: Negative for sputum production and shortness of breath.   Cardiovascular: Positive for leg swelling. Negative for chest pain.  Gastrointestinal: Positive for nausea and vomiting. Negative for abdominal pain.  Genitourinary: Negative for dysuria.  Musculoskeletal: Positive for myalgias.  Skin: Negative for itching and rash.  Neurological: Negative for focal weakness and loss of consciousness.       Positive for altered mental status  Psychiatric/Behavioral: Positive for depression. Negative for suicidal ideas.    Past Medical History:  Diagnosis Date  . Arthritis   . Diabetes mellitus age 15  . DVT (deep venous thrombosis) (HCC)   . Hyperlipidemia   . Hypertension   . Phlebitis   . Plantar fasciitis    left foot  . Sleep apnea    wears c-pap machine  . Sleep-related hypoventilation 09/05/2013  . Venous insufficiency (chronic) (peripheral)     Past Surgical History:  Procedure Laterality Date  . APPENDECTOMY    . COLONOSCOPY N/A 09/06/2013   Performed by Petra Kuba, MD at [facility] ENDOSCOPY  . KNEE SURGERY     right knee arthroscopy  . NOSE SURGERY    . RIGHT SHOULDER ARTHROSCOPY WITH DEBRIDEMENT, SUBACROMIAL DECOMPRESSION Right 04/10/2015   Performed by Kathryne Hitch, MD at [facility] ORS  . SHOULDER SURGERY     left shoulder for labral tear     reports that  has never smoked. he has never used smokeless tobacco. He reports that he does not  drink alcohol or use drugs.  No Known Allergies  Family History  Problem Relation Age of Onset  . Cancer Mother        ovarian  . Hypertension Mother   . Arthritis Mother   . Diabetes Mother   . Cancer Father        colon    Prior to Admission medications   Medication Sig Start Date End Date Taking? Authorizing Provider  acetaminophen (TYLENOL) 500 MG tablet Take 1,500 mg by mouth 2 (two) times daily as needed for moderate pain.   Yes [provider]  DULoxetine (CYMBALTA) 60 MG capsule Take 60 mg by mouth 2 (two) times daily.   Yes [provider]  HYDROcodone-acetaminophen (NORCO) 7.5-325 MG tablet Take 1-2 tablets by mouth 2 (two) times daily as needed for moderate pain. 05/31/17  Yes Kathryne HitchBlackman, Christopher Y, MD  ibuprofen (ADVIL,MOTRIN) 200 MG tablet Take 400 mg by mouth 2 (two) times daily as needed for mild pain or moderate pain.    Yes [provider]  insulin glargine (LANTUS) 100 UNIT/ML injection Inject 50 Units into the skin 2 (two) times daily.    Yes [provider]  insulin lispro (HUMALOG KWIKPEN) 100 UNIT/ML KiwkPen Inject 40-200 Units into the skin 3 (three) times daily.    Yes [provider]  metroNIDAZOLE (FLAGYL) 500 MG tablet Take 1 tablet (500 mg total) by mouth 2 (two) times daily with a meal. DO NOT CONSUME ALCOHOL WHILE TAKING THIS MEDICATION. 10/18/16  Yes Jeffery, Chelle, PA-C  diclofenac sodium (VOLTAREN) 1 % GEL Apply 4 g topically 3 (three) times daily as needed. Patient not taking: Reported on 08/07/2017 05/31/17   Kathryne HitchBlackman, Christopher Y, MD  tiZANidine (ZANAFLEX) 4 MG tablet Take 1 tablet (4 mg total) by mouth 2 (two) times daily as needed for muscle spasms. Patient not taking: Reported on 10/18/2016 10/17/16   Kathryne HitchBlackman, Christopher Y, MD    Physical Exam:  Constitutional: Obese male who currently appears to be in no acute distress and able to follow commands. Vitals:   08/07/17 2038 08/07/17 2039 08/07/17 2040  BP: 139/73 136/66   Pulse: 94 93   Resp: (!) 22    Temp: 98.4 F (36.9 C) (!) 97.5 F (36.4 C)   TempSrc: Oral Oral   SpO2: 93% 96%   Weight:   117.9 kg (260 lb)  Height:   6' (1.829 m)   Eyes: PERRL, lids and conjunctivae normal ENMT: Mucous membranes are moist. Posterior pharynx clear of any exudate or lesions.  Neck: normal, supple, no masses, no thyromegaly Respiratory: clear to auscultation bilaterally, no wheezing, no crackles. Normal respiratory  effort. No accessory muscle use.  Cardiovascular: Regular rate and rhythm, no murmurs / rubs / gallops.  Trace lower extremity edema. 2+ pedal pulses. No carotid bruits.  Abdomen: no tenderness, no masses palpated. No hepatosplenomegaly. Bowel sounds positive.  Musculoskeletal: no clubbing / cyanosis. No joint deformity upper and lower extremities. Good ROM, no contractures. Normal muscle tone.  Skin: Diaphoretic.  No rashes, lesions, ulcers. No induration Neurologic: CN 2-12 grossly intact. Sensation intact, DTR normal. Strength 5/5 in all 4.  Psychiatric: Normal judgment and insight. Alert and oriented x 3.  Agitated mood.     Labs on Admission: I have personally reviewed following labs and imaging studies  CBC: Recent Labs  Lab 08/07/17 2040  WBC 12.3*  HGB 15.9  HCT 46.5  MCV 94.1  PLT 229   Basic Metabolic Panel: Recent Labs  Lab 08/07/17 2040  NA 141  K 2.7*  CL 106  CO2 25  GLUCOSE 24*  BUN 13  CREATININE 1.06  CALCIUM 9.2   GFR: Estimated Creatinine Clearance: 100.7 mL/min (by C-G formula based on SCr of 1.06 mg/dL). Liver Function Tests: Recent Labs  Lab 08/07/17 2040  AST 29  ALT 34  ALKPHOS 73  BILITOT 0.5  PROT 7.8  ALBUMIN 4.0   No results for input(s): LIPASE, AMYLASE in the last 168 hours. Recent Labs  Lab 08/07/17 2126  AMMONIA 27   Coagulation Profile: No results for input(s): INR, PROTIME in the last 168 hours. Cardiac Enzymes: No results for input(s): CKTOTAL, CKMB, CKMBINDEX, TROPONINI in the last 168 hours. BNP (last 3 results) No results for input(s): PROBNP in the last 8760 hours. HbA1C: No results for input(s): HGBA1C in the last 72 hours. CBG: Recent Labs  Lab 08/07/17 2017 08/07/17 2032 08/07/17 2142 08/07/17 2217 08/07/17 2251  GLUCAP 22* 118* 22* 44* 16*   Lipid Profile: No results for input(s): CHOL, HDL, LDLCALC, TRIG, CHOLHDL, LDLDIRECT in the last 72 hours. Thyroid Function Tests: Recent Labs    08/07/17 2126   TSH 3.769   Anemia Panel: No results for input(s): VITAMINB12, FOLATE, FERRITIN, TIBC, IRON, RETICCTPCT in the last 72 hours. Urine analysis:    Component Value Date/Time   COLORURINE AMBER (A) 04/11/2015 2154   APPEARANCEUR CLEAR 04/11/2015 2154   LABSPEC >1.046 (H) 04/11/2015 2154   PHURINE 6.0 04/11/2015 2154   GLUCOSEU NEGATIVE 04/11/2015 2154   GLUCOSEU NEGATIVE 07/09/2008 1028   HGBUR NEGATIVE 04/11/2015 2154   BILIRUBINUR SMALL (A) 04/11/2015 2154   KETONESUR NEGATIVE 04/11/2015 2154   PROTEINUR NEGATIVE 04/11/2015 2154   UROBILINOGEN 1.0 04/11/2015 2154   NITRITE NEGATIVE 04/11/2015 2154   LEUKOCYTESUR NEGATIVE 04/11/2015 2154   Sepsis Labs: No results found for this or any previous visit (from the past 240 hour(s)).   Radiological Exams on Admission: No results found.  EKG: Independently reviewed.  Sinus rhythm with signs of bigeminy  Assessment/Plan Hypoglycemia,diabetes mellitus type 2: Acute.  Family brought patient after being found acutely altered found to have blood sugar as low as 14.  Given multiple rounds of D50 and then placed on D5 0.45% normal saline IV fluids at 75 mL/h. - Admit to stepdown bed - Hypoglycemic protocol initiated - Continue D5 0.45% NS IV fluids at 75 mL/h, will change to D10 fluids if needed - Hold home insulin - Orders placed to MD when blood sugars greater than 200 consistently - Diabetic educator consult  Acute encephalopathy: Likely associated with the above. - Continue to monitor  Leukocytosis: WBC elevated at 12.3 on admission.  Suspect that this could be reactive to patient's hypoglycemia. - Check urinalysis  Hypokalemia: Acute.  Patient was initially noted to have a potassium level of 2.7 on admission.  Patient was given 40 mEq of potassium chloride orally, but developed nausea and vomited while in ED. - Give 40 mEq of IV potassium - Check magnesium levels - Continue to monitor and replace electrolytes as needed  Chronic  Pain - Continue Cymbalta - Restart hydrocodone prn when medically appropriate - Follow-up urine drug screen  Essential hypertension: Patient not on any blood pressure medications per review of records.  Blood pressures 130/52-168/94. - hydralazine IV prn >BP  History of suidical ideation: Patient actively denies any suicidal ideations, but was noted to be complaining of issues with his wife at home.  Previous history of suicidal ideation noted  in the past for which patient was seen in the ED in 2016. - May want to consult TTS once medically stable  obesity: BMI 35.25 DVT prophylaxis:lovenox  Code Status: Full Family Communication: Discussed plan of care with patient and family at bedside Disposition Plan: Likely discharge home once medically stable Consults called: Nonee  Admission status: Observation  Clydie Braunondell A Smith MD [facility] Pager [phone]   If 7PM-7AM, please contact night-coverage [email or web address] Password [email or web address]  08/07/2017, 10:56 PM

## 2017-08-07 NOTE — ED Notes (Addendum)
Pt drinking Orange juice and eating graham crackers. MD at bedside. Critical labs of K 2.7 and glu 24 given to Dr. Jacqulyn BathLong.

## 2017-08-07 NOTE — ED Triage Notes (Signed)
Pt arrives via POV from home with altered mental status, pt last seen well 1200 today. Pt arrives diaphoretic, no response to painful stimuli, pinpoint pupils. Per family at bedside, pt stated to them that he took too much medicine, family is unsure what. CBG in triage 22. 1 amp of D0 given per Dr. Madilyn Hookees, who is a bedside in triage. Pt then following commands but still not speaking. Remains diaphoretic. Moved to room D33.

## 2017-08-07 NOTE — ED Triage Notes (Signed)
Pt presents to ER with family with reported AMS with diaphoresis; LKW per family was 3p when phone conversation was held; pts family came home at 7:45 to find patient in car who stated "something is wrong" and family drove patient here; pt arrived to ER with CBG of 22; PIV started and amp of D50 given; CBG now 118

## 2017-08-07 NOTE — ED Notes (Signed)
Pt more alert and talking with son and RN. A & O x 4.

## 2017-08-08 ENCOUNTER — Observation Stay (HOSPITAL_COMMUNITY): Payer: Medicare Other

## 2017-08-08 DIAGNOSIS — D72829 Elevated white blood cell count, unspecified: Secondary | ICD-10-CM | POA: Diagnosis present

## 2017-08-08 DIAGNOSIS — E876 Hypokalemia: Secondary | ICD-10-CM | POA: Diagnosis present

## 2017-08-08 LAB — BASIC METABOLIC PANEL
Anion gap: 8 (ref 5–15)
BUN: 10 mg/dL (ref 6–20)
CHLORIDE: 106 mmol/L (ref 101–111)
CO2: 26 mmol/L (ref 22–32)
CREATININE: 0.82 mg/dL (ref 0.61–1.24)
Calcium: 8.8 mg/dL — ABNORMAL LOW (ref 8.9–10.3)
GFR calc Af Amer: >60 mL/min (ref >60)
GFR calc non Af Amer: >60 mL/min (ref >60)
GLUCOSE: 54 mg/dL — AB (ref 65–99)
Potassium: 4.2 mmol/L (ref 3.5–5.1)
SODIUM: 140 mmol/L (ref 135–145)

## 2017-08-08 LAB — MAGNESIUM: MAGNESIUM: 1.9 mg/dL (ref 1.7–2.4)

## 2017-08-08 LAB — CBG MONITORING, ED
GLUCOSE-CAPILLARY: 100 mg/dL — AB (ref 65–99)
GLUCOSE-CAPILLARY: 115 mg/dL — AB (ref 65–99)
GLUCOSE-CAPILLARY: 167 mg/dL — AB (ref 65–99)
GLUCOSE-CAPILLARY: 33 mg/dL — AB (ref 65–99)
GLUCOSE-CAPILLARY: 39 mg/dL — AB (ref 65–99)
GLUCOSE-CAPILLARY: 55 mg/dL — AB (ref 65–99)
GLUCOSE-CAPILLARY: 64 mg/dL — AB (ref 65–99)
GLUCOSE-CAPILLARY: 67 mg/dL (ref 65–99)
Glucose-Capillary: 103 mg/dL — ABNORMAL HIGH (ref 65–99)
Glucose-Capillary: 135 mg/dL — ABNORMAL HIGH (ref 65–99)
Glucose-Capillary: 47 mg/dL — ABNORMAL LOW (ref 65–99)
Glucose-Capillary: 54 mg/dL — ABNORMAL LOW (ref 65–99)
Glucose-Capillary: 80 mg/dL (ref 65–99)
Glucose-Capillary: 88 mg/dL (ref 65–99)

## 2017-08-08 LAB — CBC
HCT: 41.4 % (ref 39.0–52.0)
HEMOGLOBIN: 14 g/dL (ref 13.0–17.0)
MCH: 31.6 pg (ref 26.0–34.0)
MCHC: 33.8 g/dL (ref 30.0–36.0)
MCV: 93.5 fL (ref 78.0–100.0)
Platelets: 168 10*3/uL (ref 150–400)
RBC: 4.43 MIL/uL (ref 4.22–5.81)
RDW: 13.2 % (ref 11.5–15.5)
WBC: 8.4 10*3/uL (ref 4.0–10.5)

## 2017-08-08 LAB — HIV ANTIBODY (ROUTINE TESTING W REFLEX): HIV Screen 4th Generation wRfx: NONREACTIVE

## 2017-08-08 MED ORDER — DEXTROSE 50 % IV SOLN
INTRAVENOUS | Status: AC
  Administered 2017-08-08: 50 mL
  Filled 2017-08-08: qty 50

## 2017-08-08 MED ORDER — DEXTROSE-NACL 5-0.45 % IV SOLN: INTRAVENOUS | Status: DC

## 2017-08-08 MED ORDER — DEXTROSE 50 % IV SOLN
50.0000 mL | Freq: Once | INTRAVENOUS | Status: AC
  Administered 2017-08-08: 50 mL via INTRAVENOUS

## 2017-08-08 MED ORDER — DEXTROSE 10 % IV SOLN
INTRAVENOUS | Status: DC
  Filled 2017-08-08: qty 1000

## 2017-08-08 MED ORDER — DEXTROSE 50 % IV SOLN
50.0000 mL | Freq: Once | INTRAVENOUS | Status: AC
  Administered 2017-08-08: 50 mL via INTRAVENOUS
  Filled 2017-08-08: qty 50

## 2017-08-08 MED ORDER — DEXTROSE 10 % IV SOLN
INTRAVENOUS | Status: DC
  Administered 2017-08-08: 03:00:00 via INTRAVENOUS

## 2017-08-08 MED ORDER — POTASSIUM CHLORIDE 10 MEQ/100ML IV SOLN
10.0000 meq | INTRAVENOUS | Status: AC
  Administered 2017-08-08 (×4): 10 meq via INTRAVENOUS
  Filled 2017-08-08 (×4): qty 100

## 2017-08-08 MED ORDER — DULOXETINE HCL 60 MG PO CPEP
60.0000 mg | ORAL_CAPSULE | Freq: Two times a day (BID) | ORAL | Status: DC
  Filled 2017-08-08: qty 1

## 2017-08-08 MED ORDER — DEXTROSE-NACL 5-0.45 % IV SOLN
INTRAVENOUS | Status: DC
  Administered 2017-08-08: 02:00:00 via INTRAVENOUS

## 2017-08-08 NOTE — ED Notes (Signed)
[facility] aware pt is requesting to leave AMA. There was concern by previous providers that the pt may have been trying to commit suicide by intentionally taking too much insulin. Pt's son bedside stating that is not the case "I know my father very well and I know he would not do something like that". Pt to sign AMA form and be discharged.

## 2017-08-08 NOTE — ED Notes (Signed)
Pt refusing to be admitted, paging MD [facility] at this time.

## 2017-08-08 NOTE — ED Notes (Signed)
cbg 67 

## 2017-08-08 NOTE — ED Notes (Signed)
Pt eating cookout burger and drinking a shake.

## 2017-08-08 NOTE — ED Notes (Signed)
AMA form signed by pt and this RN. PT ambulatory out of ED with son.

## 2017-08-08 NOTE — ED Notes (Signed)
In to pts room to hang last potassium. Pt very diaphoretic. FSBS 80. VSS. Spoke with admitting md about pts blood sugar. To increase D10 drip.

## 2017-08-08 NOTE — ED Notes (Addendum)
Pt c/o nausea. Vomited x 1.

## 2017-08-08 NOTE — ED Notes (Signed)
Patient denies pain and is resting comfortably.  

## 2017-08-08 NOTE — ED Notes (Signed)
Spoke with Dr. Katrinka BlazingSmith about pt.  Will place orders.

## 2017-08-08 NOTE — Progress Notes (Signed)
Inpatient Diabetes Program Recommendations  AACE/ADA: New Consensus Statement on Inpatient Glycemic Control (2015)  Target Ranges:  Prepandial:   less than 140 mg/dL      Peak postprandial:   less than 180 mg/dL (1-2 hours)      Critically ill patients:  140 - 180 mg/dL   Late entry  Finally spoke with Dr. Lanell MatarAronson's office about patient and his regimen outpatient.  Patient is prescribed Lantus 50 units BID and Humalog 80-90 units tid with meals. Nothing is mentioned in Dr. Lanell MatarAronson's progress notes about patient self titrating insulin. Dr. Jacky KindleAronson is out of the office unfortunately and cannot verify information. [facility] me Dr. Lanell MatarAronson's progress note from patient's physical on October 18th. Dr. Jacky KindleAronson wanted to see patient in 4 months.   Thanks,  Christena DeemShannon Mattison Golay RN, MSN, [facility] Inpatient Diabetes Coordinator Team Pager [phone] (8a-5p)

## 2017-08-08 NOTE — ED Notes (Signed)
Pt sleeping soundly. No distress noted.  

## 2017-08-08 NOTE — ED Notes (Signed)
Spoke with admitting md in the hall about pt status and blood sugar that was just 54. Told me to page him if it continues to drop.

## 2017-08-08 NOTE — ED Notes (Signed)
Patient aware that a urine sample is needed 

## 2017-08-08 NOTE — Progress Notes (Signed)
Inpatient Diabetes Program Recommendations  AACE/ADA: New Consensus Statement on Inpatient Glycemic Control (2015)  Target Ranges:  Prepandial:   less than 140 mg/dL      Peak postprandial:   less than 180 mg/dL (1-2 hours)      Critically ill patients:  140 - 180 mg/dL   Review of Glycemic Control  Diabetes history: DM 2  Outpatient Diabetes medications: Lantus 50 units BID, Humalog 40-200 units tid Current orders for Inpatient glycemic control: None  Inpatient Diabetes Program Recommendations:    Patient [facility] Patient last saw Dr. Jacky KindleAronson  approx 1 month ago. Patient does not remember his A1c level at that time. No adjustments were made with his medication regimen.    Patient reports having Diabetes for years and that he would call himself "brittle". Patient reports that he has to self dose because he is not text book. Patient reports he has not ever responded to insulin like other people and that Dr. Jacky KindleAronson knows.   I have called Dr. Lanell MatarAronson's office and are waiting on call back to verify patient information (medication regimen, A1c level, glucose trends and patient plan of care).  Patient reports not intentionally giving himself insulin. Patient reports that he has a supportive family and he has a 58 year old granddaughter he wants to watch grow up.    Thanks,  Christena DeemShannon Licia Harl RN, MSN, [facility] Inpatient Diabetes Coordinator Team Pager [phone] (8a-5p)

## 2017-08-08 NOTE — ED Notes (Signed)
Pt drinking Mt. Dew. States that he feels fine. Family at bedside. VSS. FSBS was 55. Will repeat in 20-30 minutes to see if trending up or down. Alert and orient x 4.

## 2017-08-08 NOTE — ED Notes (Signed)
Gave pt. 2 orange juices

## 2017-08-08 NOTE — ED Notes (Addendum)
Pt vomiting at this time. States that he feels better after getting sick. Will continue to monitor. Refuses medication for nausea.

## 2017-08-09 LAB — CBG MONITORING, ED: GLUCOSE-CAPILLARY: 32 mg/dL — AB (ref 65–99)

## 2017-08-30 ENCOUNTER — Ambulatory Visit (INDEPENDENT_AMBULATORY_CARE_PROVIDER_SITE_OTHER): Payer: Medicare Other | Admitting: Orthopaedic Surgery

## 2017-10-18 ENCOUNTER — Telehealth (INDEPENDENT_AMBULATORY_CARE_PROVIDER_SITE_OTHER): Payer: Self-pay | Admitting: Orthopaedic Surgery

## 2017-10-18 NOTE — Telephone Encounter (Signed)
[employer] pt tried to pick up gel and couldn't due to insurance wanting to speak with his doctor.   Diclofenac 1% Gel

## 2017-10-19 NOTE — Telephone Encounter (Signed)
Per insurance prior auth not needed, called the pharmacy to let them know this

## 2017-11-13 ENCOUNTER — Other Ambulatory Visit (INDEPENDENT_AMBULATORY_CARE_PROVIDER_SITE_OTHER): Payer: Self-pay | Admitting: Orthopaedic Surgery

## 2017-11-13 NOTE — Telephone Encounter (Signed)
Please advise 

## 2017-12-17 ENCOUNTER — Other Ambulatory Visit (INDEPENDENT_AMBULATORY_CARE_PROVIDER_SITE_OTHER): Payer: Self-pay | Admitting: Orthopaedic Surgery

## 2017-12-18 NOTE — Telephone Encounter (Signed)
Please advise 

## 2018-01-17 ENCOUNTER — Other Ambulatory Visit (INDEPENDENT_AMBULATORY_CARE_PROVIDER_SITE_OTHER): Payer: Self-pay | Admitting: Orthopaedic Surgery

## 2018-01-17 NOTE — Telephone Encounter (Signed)
Please advise 

## 2018-02-17 ENCOUNTER — Other Ambulatory Visit (INDEPENDENT_AMBULATORY_CARE_PROVIDER_SITE_OTHER): Payer: Self-pay | Admitting: Orthopaedic Surgery

## 2018-02-19 NOTE — Telephone Encounter (Signed)
Please advise 

## 2018-03-24 ENCOUNTER — Other Ambulatory Visit (INDEPENDENT_AMBULATORY_CARE_PROVIDER_SITE_OTHER): Payer: Self-pay | Admitting: Orthopaedic Surgery

## 2018-03-26 NOTE — Telephone Encounter (Signed)
Please advise 

## 2018-04-21 ENCOUNTER — Other Ambulatory Visit (INDEPENDENT_AMBULATORY_CARE_PROVIDER_SITE_OTHER): Payer: Self-pay | Admitting: Orthopaedic Surgery

## 2018-04-23 NOTE — Telephone Encounter (Signed)
Please advise 

## 2018-06-13 ENCOUNTER — Other Ambulatory Visit (INDEPENDENT_AMBULATORY_CARE_PROVIDER_SITE_OTHER): Payer: Self-pay | Admitting: Orthopaedic Surgery

## 2018-07-26 ENCOUNTER — Other Ambulatory Visit (INDEPENDENT_AMBULATORY_CARE_PROVIDER_SITE_OTHER): Payer: Self-pay | Admitting: Orthopaedic Surgery

## 2018-09-13 ENCOUNTER — Other Ambulatory Visit (INDEPENDENT_AMBULATORY_CARE_PROVIDER_SITE_OTHER): Payer: Self-pay | Admitting: Physician Assistant

## 2018-09-13 NOTE — Telephone Encounter (Signed)
Ok to refill 

## 2019-02-04 ENCOUNTER — Other Ambulatory Visit (INDEPENDENT_AMBULATORY_CARE_PROVIDER_SITE_OTHER): Payer: Self-pay | Admitting: Orthopaedic Surgery

## 2020-03-18 ENCOUNTER — Telehealth: Payer: Self-pay | Admitting: Orthopaedic Surgery

## 2020-03-18 NOTE — Telephone Encounter (Signed)
Pt would like a CB; states theres been a mix up.  [phone]

## 2020-03-18 NOTE — Telephone Encounter (Signed)
Wife states she would like you to call her about her husband being incarcerated.

## 2021-02-22 ENCOUNTER — Other Ambulatory Visit: Payer: Self-pay | Admitting: Urology

## 2021-02-24 ENCOUNTER — Encounter (HOSPITAL_COMMUNITY): Payer: Self-pay

## 2021-02-24 ENCOUNTER — Other Ambulatory Visit: Payer: Self-pay

## 2021-02-24 ENCOUNTER — Encounter (HOSPITAL_COMMUNITY)
Admission: RE | Admit: 2021-02-24 | Discharge: 2021-02-24 | Disposition: A | Discharge status: Home / Self Care | Payer: Medicare Other | Source: Ambulatory Visit | Attending: Urology | Admitting: Urology

## 2021-02-24 DIAGNOSIS — I4510 Unspecified right bundle-branch block: Secondary | ICD-10-CM | POA: Insufficient documentation

## 2021-02-24 DIAGNOSIS — Z01818 Encounter for other preprocedural examination: Secondary | ICD-10-CM | POA: Insufficient documentation

## 2021-02-24 HISTORY — DX: Chronic kidney disease, stage 3 unspecified: N18.30

## 2021-02-24 LAB — BASIC METABOLIC PANEL
Anion gap: 9 (ref 5–15)
BUN: 19 mg/dL (ref 8–23)
CO2: 26 mmol/L (ref 22–32)
Calcium: 8.9 mg/dL (ref 8.9–10.3)
Chloride: 105 mmol/L (ref 98–111)
Creatinine, Ser: 1.02 mg/dL (ref 0.61–1.24)
GFR, Estimated: >60 mL/min (ref >60)
Glucose, Bld: 213 mg/dL — ABNORMAL HIGH (ref 70–99)
Potassium: 4 mmol/L (ref 3.5–5.1)
Sodium: 140 mmol/L (ref 135–145)

## 2021-02-24 LAB — CBC
HCT: 44.2 % (ref 39.0–52.0)
Hemoglobin: 14.8 g/dL (ref 13.0–17.0)
MCH: 30.8 pg (ref 26.0–34.0)
MCHC: 33.5 g/dL (ref 30.0–36.0)
MCV: 92.1 fL (ref 80.0–100.0)
Platelets: 154 10*3/uL (ref 150–400)
RBC: 4.8 MIL/uL (ref 4.22–5.81)
RDW: 12.6 % (ref 11.5–15.5)
WBC: 7.7 10*3/uL (ref 4.0–10.5)
nRBC: 0 % (ref 0.0–0.2)

## 2021-02-24 NOTE — Patient Instructions (Addendum)
DUE TO COVID-19 ONLY ONE VISITOR IS ALLOWED TO COME WITH YOU AND STAY IN THE WAITING ROOM ONLY DURING PRE OP AND PROCEDURE DAY OF SURGERY. THE 1 VISITOR  MAY VISIT WITH YOU AFTER SURGERY IN YOUR PRIVATE ROOM DURING VISITING HOURS ONLY!               WEILAND TOMICH  02/24/2021   Your procedure is scheduled on: Tuesday 03/02/2021   Report to [facility] Main  Entrance   Report to admitting at 0830 AM     Call this number if you have problems the morning of surgery [phone]    Remember: Do not eat food After Midnight. Clear liquids from Midnight till 0730 am then nothing by mouth.   BRUSH YOUR TEETH MORNING OF SURGERY AND RINSE YOUR MOUTH OUT, NO CHEWING GUM CANDY OR MINTS.     Take these medicines the morning of surgery with A SIP OF WATER: Atorvastatin, Bupropion, Duloxetine, may use Trelegy Inhaler if needed, Pantoprozole  DO NOT TAKE ANY DIABETIC MEDICATIONS DAY OF YOUR SURGERY                               You may not have any metal on your body including hair pins and              piercings  Do not wear jewelry, lotions, powders or colognes, deodorant                      Men may shave face and neck.   Do not bring valuables to the hospital. [facility] IS NOT             RESPONSIBLE   FOR VALUABLES.  Contacts, dentures or bridgework may not be worn into surgery.      Patients discharged the day of surgery will not be allowed to drive home. IF YOU ARE HAVING SURGERY AND GOING HOME THE SAME DAY, YOU MUST HAVE AN ADULT TO DRIVE YOU HOME AND BE WITH YOU FOR 24 HOURS. YOU MAY GO HOME BY TAXI OR UBER OR ORTHERWISE, BUT AN ADULT MUST ACCOMPANY YOU HOME AND STAY WITH YOU FOR 24 HOURS.  _____________________________________________________________________                CLEAR LIQUID DIET   Foods Allowed                                                                     Foods Excluded  Coffee and tea, regular and decaf                             liquids that you  cannot  Plain Jell-O any favor except red or purple                                           see through such as: Fruit ices (not with fruit pulp)  milk, soups, orange juice  Iced Popsicles                                    All solid food Carbonated beverages, regular and diet                                    Cranberry, grape and apple juices Sports drinks like Gatorade Lightly seasoned clear broth or consume(fat free) Sugar, honey syrup  Sample Menu Breakfast                                Lunch                                     Supper Cranberry juice                    Beef broth                            Chicken broth Jell-O                                     Grape juice                           Apple juice Coffee or tea                        Jell-O                                      Popsicle                                                Coffee or tea                        Coffee or tea  _____________________________________________________________________  [facility] - Preparing for Surgery Before surgery, you can play an important role.  Because skin is not sterile, your skin needs to be as free of germs as possible.  You can reduce the number of germs on your skin by washing with CHG (chlorahexidine gluconate) soap before surgery.  CHG is an antiseptic cleaner which kills germs and bonds with the skin to continue killing germs even after washing. Please DO NOT use if you have an allergy to CHG or antibacterial soaps.  If your skin becomes reddened/irritated stop using the CHG and inform your nurse when you arrive at Short Stay. Do not shave (including legs and underarms) for at least 48 hours prior to the first CHG shower.  You may shave your face/neck. Please follow these instructions carefully:  1.  Shower with CHG Soap the night before surgery and the  morning of Surgery.  2.  If you choose to wash  your hair, wash your hair first as  usual with your  normal  shampoo.  3.  After you shampoo, rinse your hair and body thoroughly to remove the  shampoo.                           4.  Use CHG as you would any other liquid soap.  You can apply chg directly  to the skin and wash                       Gently with a scrungie or clean washcloth.  5.  Apply the CHG Soap to your body ONLY FROM THE NECK DOWN.   Do not use on face/ open                           Wound or open sores. Avoid contact with eyes, ears mouth and genitals (private parts).                       Wash face,  Genitals (private parts) with your normal soap.             6.  Wash thoroughly, paying special attention to the area where your surgery  will be performed.  7.  Thoroughly rinse your body with warm water from the neck down.  8.  DO NOT shower/wash with your normal soap after using and rinsing off  the CHG Soap.                9.  Pat yourself dry with a clean towel.            10.  Wear clean pajamas.            11.  Place clean sheets on your bed the night of your first shower and do not  sleep with pets. Day of Surgery : Do not apply any lotions/deodorants the morning of surgery.  Please wear clean clothes to the hospital/surgery center.  FAILURE TO FOLLOW THESE INSTRUCTIONS MAY RESULT IN THE CANCELLATION OF YOUR SURGERY PATIENT SIGNATURE_________________________________  NURSE SIGNATURE__________________________________  ________________________________________________________________________ How to Manage Your Diabetes Before and After Surgery  Why is it important to control my blood sugar before and after surgery? . Improving blood sugar levels before and after surgery helps healing and can limit problems. . A way of improving blood sugar control is eating a healthy diet by: o  Eating less sugar and carbohydrates o  Increasing activity/exercise o  Talking with your doctor about reaching your blood sugar goals . High blood sugars (greater than 180  mg/dL) can raise your risk of infections and slow your recovery, so you will need to focus on controlling your diabetes during the weeks before surgery. . Make sure that the doctor who takes care of your diabetes knows about your planned surgery including the date and location.  How do I manage my blood sugar before surgery? . Check your blood sugar at least 4 times a day, starting 2 days before surgery, to make sure that the level is not too high or low. o Check your blood sugar the morning of your surgery when you wake up and every 2 hours until you get to the Short Stay unit. . If your blood sugar is less than 70 mg/dL, you will need to treat for low blood sugar: o Do not take  insulin. o Treat a low blood sugar (less than 70 mg/dL) with  cup of clear juice (cranberry or apple), 4 glucose tablets, OR glucose gel. o Recheck blood sugar in 15 minutes after treatment (to make sure it is greater than 70 mg/dL). If your blood sugar is not greater than 70 mg/dL on recheck, call [phone] for further instructions. . Report your blood sugar to the short stay nurse when you get to Short Stay.  . If you are admitted to the hospital after surgery: o Your blood sugar will be checked by the staff and you will probably be given insulin after surgery (instead of oral diabetes medicines) to make sure you have good blood sugar levels. o The goal for blood sugar control after surgery is 80-180 mg/dL.   WHAT DO I DO ABOUT MY DIABETES MEDICATION?  Marland Kitchen Do not take oral diabetes medicines (pills) the morning of surgery.  . THE NIGHT BEFORE SURGERY, take insulin as regularly taken   . The day of surgery, do not take other diabetes injectables, including Byetta (exenatide), Bydureon (exenatide ER), Victoza (liraglutide), or Trulicity (dulaglutide).  . If your CBG is greater than 220 mg/dL, you may take up to 1/4 of your sliding scale  . (correction) dose of insulin.  PLEASE CONTACT YOUR PRIMARY DOCTOR FOR  SPECIFIC INSTRUCTIONS ON REGULATION OF YOUR INSULIN AND DIABETES PRIOR TO SURGICAL PROCEDURE  For patients with insulin pumps: Contact your diabetes doctor for specific instructions before surgery. Decrease basal rates by 20% at midnight the night before your surgery. Note that if your surgery is planned to be longer than 2 hours, your insulin pump will be removed and intravenous (IV) insulin will be started and managed by the nurses and the anesthesiologist. You will be able to restart your insulin pump once you are awake and able to manage it.  Make sure to bring insulin pump supplies to the hospital with you in case the  site needs to be changed.  Patient Signature:  Date:   Nurse Signature:  Date:   Reviewed and Endorsed by [facility] Patient Education Committee, August 2015

## 2021-02-24 NOTE — Progress Notes (Signed)
PCP Dr Geoffry Paradise  EKG/epic 02/24/2021 per PAT appt  Pt has history of sleep apnea but does not use CPAP  Pt states he checks his cbg's according to how he feels. He takes humalog 80 units up to 6 times per day. Spoke with Pamala Hurry diabetic coordinator for [facility] who suggested pt speak with his primary MD in regards to diabetic management and insulin adjustments prior to surgery. Pt instructed to take 1/4 of am dose of insulin if cbg greater than 220.   Patient denies any SOB, fever, cough or chest pain during PAT appointment.  Patient verbalized understanding of instructions that were given to them at the PAT appointment. Patient was also instructed that they will need to review over the PAT instructions again at home before surgery.

## 2021-02-25 ENCOUNTER — Encounter (HOSPITAL_COMMUNITY): Payer: Self-pay

## 2021-02-25 NOTE — Progress Notes (Signed)
Anesthesia Chart Review:  Case: [id number] Date/Time: 03/02/21 1018   Procedure: PENILE BIOPSY   Anesthesia type: General   Pre-op diagnosis: PENILE LESION   [facility]   Surgeons: Bjorn Pippin, MD       DISCUSSION: Pt is 60 years old with hx HTN, DM, DVT (RLE, s/p arthroscopy 2012), OSA, CKD stage 3a   VS: BP (!) 176/78   Pulse 88   Temp 37.3 C (Oral)   Resp 20   Ht 6' (1.829 m)   Wt (!) 146.4 kg   SpO2 97%   BMI 43.78 kg/m    PROVIDERS: - PCP is Geoffry Paradise, MD. Last office visit 02/01/21   LABS:  - HbA1c still pending.  Prior A1c at PCP office was 9.0 on 09/15/20 - Random gucose 213  (all labs ordered are listed, but only abnormal results are displayed)  Labs Reviewed  BASIC METABOLIC PANEL - Abnormal; Notable for the following components:      Result Value   Glucose, Bld 213 (*)    All other components within normal limits  CBC  HEMOGLOBIN A1C    EKG 02/24/21: NSR. Low voltage QRS. Incomplete RBBB   CV: N/A   Past Medical History:  Diagnosis Date   Arthritis    CKD (chronic kidney disease) stage 3, GFR 30-59 ml/min (HCC)    Diabetes mellitus age 50   DVT (deep venous thrombosis) (HCC)    Hyperlipidemia    Hypertension    Phlebitis    Plantar fasciitis    left foot   Sleep apnea    wears c-pap machine   Sleep-related hypoventilation 09/05/2013   Venous insufficiency (chronic) (peripheral)     Past Surgical History:  Procedure Laterality Date   APPENDECTOMY     COLONOSCOPY N/A 09/06/2013   Procedure: COLONOSCOPY;  Surgeon: Petra Kuba, MD;  Location: [facility];  Service: Endoscopy;  Laterality: N/A;   KNEE SURGERY     right knee arthroscopy   NOSE SURGERY     SHOULDER ARTHROSCOPY Right 04/10/2015   Procedure: RIGHT SHOULDER ARTHROSCOPY WITH DEBRIDEMENT, SUBACROMIAL DECOMPRESSION;  Surgeon: Kathryne Hitch, MD;  Location: [facility];  Service: Orthopedics;  Laterality: Right;   SHOULDER SURGERY     left  shoulder for labral tear    MEDICATIONS:  atorvastatin (LIPITOR) 10 MG tablet   betamethasone dipropionate 0.05 % cream   buPROPion (WELLBUTRIN XL) 300 MG 24 hr tablet   DULoxetine (CYMBALTA) 60 MG capsule   Fluticasone-Umeclidin-Vilant (TRELEGY ELLIPTA) 200-62.5-25 MCG/INH AEPB   ibuprofen (ADVIL,MOTRIN) 200 MG tablet   insulin lispro (HUMALOG) 100 UNIT/ML KiwkPen   losartan (COZAAR) 50 MG tablet   metFORMIN (GLUCOPHAGE) 1000 MG tablet   pantoprazole (PROTONIX) 40 MG tablet   testosterone cypionate (DEPOTESTOSTERONE CYPIONATE) 200 MG/ML injection   vitamin B-12 (CYANOCOBALAMIN) 1000 MCG tablet   No current facility-administered medications for this encounter.    If glucose acceptable day of surgery, I anticipate pt can proceed with surgery as scheduled.  Rica Mast, PhD, FNP-BC [facility]/Anesthesiology Phone: [phone] 02/25/2021 4:10 PM

## 2021-02-25 NOTE — Anesthesia Preprocedure Evaluation (Addendum)
Anesthesia Evaluation  Patient identified by MRN, date of birth, ID band Patient awake    Reviewed: Allergy & Precautions, NPO status , Patient's Chart, lab work & pertinent test results  History of Anesthesia Complications Negative for: history of anesthetic complications  Airway Mallampati: III  TM Distance: >3 FB Neck ROM: Full    Dental  (+) Dental Advisory Given, Teeth Intact   Pulmonary sleep apnea , Recent URI ,    breath sounds clear to auscultation       Cardiovascular hypertension, Pt. on medications  Rhythm:Regular     Neuro/Psych negative neurological ROS  negative psych ROS   GI/Hepatic Neg liver ROS, GERD  ,  Endo/Other  diabetesMorbid obesity  Renal/GU CRFRenal diseaseLab Results      Component                Value               Date                      CREATININE               1.02                02/24/2021           Lab Results      Component                Value               Date                      K                        4.0                 02/24/2021                Musculoskeletal  (+) Arthritis ,   Abdominal   Peds  Hematology   Anesthesia Other Findings Patient with improving but active uri with symptoms of post nasal drip and improved hoarseness. No active cough today, covid neg, will proceed given concern for cancer  Reproductive/Obstetrics                            Anesthesia Physical Anesthesia Plan  ASA: 3  Anesthesia Plan: MAC   Post-op Pain Management:    Induction: Intravenous  PONV Risk Score and Plan: 1 and Propofol infusion  Airway Management Planned: Nasal Cannula  Additional Equipment: None  Intra-op Plan:   Post-operative Plan:   Informed Consent: I have reviewed the patients History and Physical, chart, labs and discussed the procedure including the risks, benefits and alternatives for the proposed anesthesia with the patient or  authorized representative who has indicated his/her understanding and acceptance.     Dental advisory given  Plan Discussed with: CRNA and Surgeon  Anesthesia Plan Comments: (See APP note by Durel Salts, FNP   )       Anesthesia Quick Evaluation

## 2021-02-26 LAB — HEMOGLOBIN A1C
Hgb A1c MFr Bld: 9.4 % — ABNORMAL HIGH (ref 4.8–5.6)
Mean Plasma Glucose: 223 mg/dL

## 2021-03-01 ENCOUNTER — Other Ambulatory Visit (HOSPITAL_COMMUNITY)
Admission: RE | Admit: 2021-03-01 | Discharge: 2021-03-01 | Disposition: A | Discharge status: Home / Self Care | Payer: Medicare Other | Source: Ambulatory Visit | Attending: Urology | Admitting: Urology

## 2021-03-01 DIAGNOSIS — Z01812 Encounter for preprocedural laboratory examination: Secondary | ICD-10-CM | POA: Diagnosis present

## 2021-03-01 DIAGNOSIS — Z20822 Contact with and (suspected) exposure to covid-19: Secondary | ICD-10-CM | POA: Insufficient documentation

## 2021-03-01 LAB — SARS CORONAVIRUS 2 (TAT 6-24 HRS): SARS Coronavirus 2: NEGATIVE

## 2021-03-01 NOTE — H&P (Signed)
I have irritation and discomfort of the head of my penis.  HPI: [name] is a 60 year-old male patient who was referred by Dr. Pearletha Furl. Jacky Kindle, MD who is here for irritation and discomfort of the head of his penis.    [name] is a 60 yo male who I last saw in 2018 for hematuria. He was found to have bilateral renal stones on CT. He failed f/u for cystoscopy. He report that he has had a couple of stones pass in the last 4 year. He is sent back now by Dr. Jacky Kindle for a non-healing ulcer on the foreskin. He noted the lesion 2-3 months ago. He had no trauma. It has not responded to topical therapy. He has some bleeding from the lesion. It is more like a sore rash and there is some involvement of the glans. He has had some moisture related issues in the past.      ALLERGIES: No Allergies    MEDICATIONS: Atorvastatin Calcium 10 mg tablet  Duloxetine Hcl 60 mg capsule,delayed release  HumaLOG 100 UNIT/ML Subcutaneous Solution Subcutaneous  Lantus SoloStar 100 UNIT/ML SOLN Subcutaneous  Losartan Potassium 50 mg tablet     GU PSH: None     PSH Notes: Knee Surgery Right, Shoulder Surgery Left, Appendectomy   NON-GU PSH: Appendectomy - 2013     GU PMH: Flank Pain - 2018 Gross hematuria, He has intermittent gross hematuria with some flank pain but no obstructive stones and the stones have been stable for a few years. He could have a UTI. He will get the Cipro but I am going to have him return for a CT AP with IV contrast and cystoscopy with voiding studies. - 2018 Incomplete bladder emptying - 2018 Renal calculus (Stable) - 2018 ED due to arterial insufficiency, Erectile dysfunction due to arterial insufficiency - 2014 Primary hypogonadism, Hypogonadism, testicular - 2014      PMH Notes:  1898-09-19 00:00:00 - Note: Normal Routine History And Physical Adult  2012-04-24 13:46:55 - Note: Venous Thrombosis Of The Deep Vessels Of The Lower Extremity  2012-04-24 13:41:27 - Note:  Arthritis   NON-GU PMH: Gout, Gout - 2014 Obesity, Morbid Obesity - 2014 Personal history of other diseases of the digestive system, History of esophageal reflux - 2014 Personal history of other endocrine, nutritional and metabolic disease, History of diabetes mellitus - 2014 Arthritis Diabetes Type 2 DVT, History GERD Hypercholesterolemia Hypertension Sleep Apnea    FAMILY HISTORY: 2 sons - Other, Other Arthritis - Runs In Family Colon Cancer - Father Death In The Family Father - Runs In Family, Mother Death In The Family Mother - Mother, Runs In Family, Father Diabetes - Runs in Family Family Health Status Children is 1 daughter and 1 - Runs In Family Hematuria - Father Hypertension - Father nephrolithiasis - Father ovarian cancer - Mother Prostate Cancer - Father   SOCIAL HISTORY: Marital Status: Divorced Preferred Language: English; Race: White Current Smoking Status: Patient has never smoked.   Tobacco Use Assessment Completed: Used Tobacco in last 30 days? Does not use smokeless tobacco. Has never drank.  Does not use drugs. Drinks 1 caffeinated drink per day. Has not had a blood transfusion. Patient's occupation Photographer.     Notes: Caffeine Use, Marital History - Currently Married, Never A Smoker, Alcohol Use, Tobacco Use, Physical Disability Affecting Ability To Work   REVIEW OF SYSTEMS:    GU Review Male:   Patient reports frequent urination, get up  at night to urinate, leakage of urine, stream starts and stops, trouble starting your stream, erection problems, and penile pain. Patient denies hard to postpone urination, burning/ pain with urination, and have to strain to urinate .  Gastrointestinal (Upper):   Patient denies nausea, vomiting, and indigestion/ heartburn.  Gastrointestinal (Lower):   Patient denies diarrhea and constipation.  Constitutional:   Patient reports fatigue and weight loss. Patient denies night sweats and  fever.  Skin:   Patient reports skin rash/ lesion and itching.   Eyes:   Patient reports blurred vision. Patient denies double vision.  Ears/ Nose/ Throat:   Patient reports sore throat and sinus problems.   Hematologic/Lymphatic:   Patient denies swollen glands and easy bruising.  Cardiovascular:   Patient reports leg swelling. Patient denies chest pains.  Respiratory:   Patient reports cough. Patient denies shortness of breath.  Endocrine:   Patient denies excessive thirst.  Musculoskeletal:   Patient reports back pain and joint pain.   Neurological:   Patient reports headaches. Patient denies dizziness.  Psychologic:   Patient denies depression and anxiety.   Notes: painful intercourse weak stream    VITAL SIGNS:      02/22/2021 08:21 AM  Weight 325 lb / 147.42 kg  Height 72 in / 182.88 cm  BP 169/89 mmHg  Heart Rate 78 /min  Temperature 97.0 F / 36.1 C  BMI 44.1 kg/m   GU PHYSICAL EXAMINATION:    Scrotum: No lesions. No edema. No cysts. No warts.  Epididymides: Right: no spermatocele, no masses, no cysts, no tenderness, no induration, no enlargement. Left: no spermatocele, no masses, no cysts, no tenderness, no induration, no enlargement.  Testes: No tenderness, no swelling, no enlargement left testes. No tenderness, no swelling, no enlargement right testes. Normal location left testes. Normal location right testes. No mass, no cyst, no varicocele, no hydrocele left testes. No mass, no cyst, no varicocele, no hydrocele right testes.  Urethral Meatus: Normal size. No lesion, no wart, no discharge, no polyp. Normal location.  Penis: Circumcised, buried penis with large fat pad. There is a 3x3 cm lesion in the right coronal sulcus and onto the glans. It is red and moist, no meatal stenosis.    MULTI-SYSTEM PHYSICAL EXAMINATION:    Constitutional: Obese. No physical deformities. Normally developed. Good grooming.   Respiratory: Normal breath sounds. No labored breathing, no use of  accessory muscles.   Cardiovascular: Normal temperature, normal extremity pulses, no swelling, no varicosities.  Lymphatic: No enlargement, no tenderness of groin lymph nodes.  Gastrointestinal: Obese abdomen. No hernia. No mass, no tenderness, no rigidity.      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 I reviewed the risks of bleeding ,infection, penile injury, scarring, pain, thrombotic events and anesthetic complications.   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 I will refer to a reconstructive urologist depending on the biopsy results.   3   Renal calculus - N20.0 Minor - He has passed a few stones in the last 4 years.    PLAN:  Schedule Return Visit/Planned Activity: ASAP - Schedule Surgery          Document Letter(s):  Created for Patient: Clinical Summary         Notes:   CC: Dr. Geoffry Paradise.         Next Appointment:      Next Appointment: 03/02/2021 10:30 AM    Appointment Type: Surgery     Location: [facility] [phone]    Provider: Bjorn Pippin, M.D.    Reason for Visit: OP WL PENILE BX

## 2021-03-02 ENCOUNTER — Encounter (HOSPITAL_COMMUNITY)
Admission: RE | Disposition: A | Discharge status: Home / Self Care | Payer: Medicare Other | Source: Home / Self Care | Attending: Urology

## 2021-03-02 ENCOUNTER — Encounter (HOSPITAL_COMMUNITY): Payer: Self-pay | Admitting: Urology

## 2021-03-02 ENCOUNTER — Ambulatory Visit (HOSPITAL_COMMUNITY): Payer: Medicare Other | Admitting: Emergency Medicine

## 2021-03-02 ENCOUNTER — Ambulatory Visit (HOSPITAL_COMMUNITY)
Admission: RE | Admit: 2021-03-02 | Discharge: 2021-03-02 | Disposition: A | Discharge status: Home / Self Care | Payer: Medicare Other | Attending: Urology | Admitting: Urology

## 2021-03-02 ENCOUNTER — Ambulatory Visit (HOSPITAL_COMMUNITY): Payer: Medicare Other | Admitting: Physician Assistant

## 2021-03-02 ENCOUNTER — Other Ambulatory Visit: Payer: Self-pay

## 2021-03-02 DIAGNOSIS — Z79899 Other long term (current) drug therapy: Secondary | ICD-10-CM | POA: Diagnosis not present

## 2021-03-02 DIAGNOSIS — B977 Papillomavirus as the cause of diseases classified elsewhere: Secondary | ICD-10-CM | POA: Diagnosis not present

## 2021-03-02 DIAGNOSIS — Z800 Family history of malignant neoplasm of digestive organs: Secondary | ICD-10-CM | POA: Insufficient documentation

## 2021-03-02 DIAGNOSIS — Z86718 Personal history of other venous thrombosis and embolism: Secondary | ICD-10-CM | POA: Insufficient documentation

## 2021-03-02 DIAGNOSIS — Q5564 Hidden penis: Secondary | ICD-10-CM | POA: Diagnosis not present

## 2021-03-02 DIAGNOSIS — N200 Calculus of kidney: Secondary | ICD-10-CM | POA: Diagnosis not present

## 2021-03-02 DIAGNOSIS — N476 Balanoposthitis: Secondary | ICD-10-CM | POA: Insufficient documentation

## 2021-03-02 DIAGNOSIS — Z794 Long term (current) use of insulin: Secondary | ICD-10-CM | POA: Insufficient documentation

## 2021-03-02 DIAGNOSIS — D074 Carcinoma in situ of penis: Secondary | ICD-10-CM | POA: Diagnosis not present

## 2021-03-02 HISTORY — PX: PENILE BIOPSY: SHX6013

## 2021-03-02 LAB — GLUCOSE, CAPILLARY
Glucose-Capillary: 180 mg/dL — ABNORMAL HIGH (ref 70–99)
Glucose-Capillary: 201 mg/dL — ABNORMAL HIGH (ref 70–99)

## 2021-03-02 SURGERY — BIOPSY, PENIS
Anesthesia: Monitor Anesthesia Care

## 2021-03-02 MED ORDER — BUPIVACAINE HCL (PF) 0.5 % IJ SOLN
INTRAMUSCULAR | Status: AC
  Filled 2021-03-02: qty 30

## 2021-03-02 MED ORDER — FENTANYL CITRATE (PF) 100 MCG/2ML IJ SOLN: 25.0000 ug | INTRAMUSCULAR | Status: DC | PRN

## 2021-03-02 MED ORDER — BUPIVACAINE HCL (PF) 0.5 % IJ SOLN
INTRAMUSCULAR | Status: DC | PRN
  Administered 2021-03-02: 2.5 mL

## 2021-03-02 MED ORDER — ACETAMINOPHEN 325 MG PO TABS: 650.0000 mg | ORAL_TABLET | ORAL | Status: DC | PRN

## 2021-03-02 MED ORDER — ORAL CARE MOUTH RINSE: 15.0000 mL | Freq: Once | OROMUCOSAL | Status: AC

## 2021-03-02 MED ORDER — ONDANSETRON HCL 4 MG/2ML IJ SOLN
INTRAMUSCULAR | Status: DC | PRN
  Administered 2021-03-02: 4 mg via INTRAVENOUS

## 2021-03-02 MED ORDER — MIDAZOLAM HCL 5 MG/5ML IJ SOLN
INTRAMUSCULAR | Status: DC | PRN
  Administered 2021-03-02: 2 mg via INTRAVENOUS

## 2021-03-02 MED ORDER — PROPOFOL 10 MG/ML IV BOLUS
INTRAVENOUS | Status: AC
  Filled 2021-03-02: qty 20

## 2021-03-02 MED ORDER — MIDAZOLAM HCL 2 MG/2ML IJ SOLN
INTRAMUSCULAR | Status: AC
  Filled 2021-03-02: qty 2

## 2021-03-02 MED ORDER — MORPHINE SULFATE (PF) 4 MG/ML IV SOLN: 2.0000 mg | INTRAVENOUS | Status: DC | PRN

## 2021-03-02 MED ORDER — SODIUM CHLORIDE 0.9 % IV SOLN: 250.0000 mL | INTRAVENOUS | Status: DC | PRN

## 2021-03-02 MED ORDER — LIDOCAINE 2% (20 MG/ML) 5 ML SYRINGE
INTRAMUSCULAR | Status: AC
  Filled 2021-03-02: qty 5

## 2021-03-02 MED ORDER — OXYCODONE HCL 5 MG PO TABS: 5.0000 mg | ORAL_TABLET | Freq: Once | ORAL | Status: DC | PRN

## 2021-03-02 MED ORDER — ACETAMINOPHEN 10 MG/ML IV SOLN: 1000.0000 mg | Freq: Once | INTRAVENOUS | Status: DC | PRN

## 2021-03-02 MED ORDER — FENTANYL CITRATE (PF) 100 MCG/2ML IJ SOLN
INTRAMUSCULAR | Status: AC
  Filled 2021-03-02: qty 2

## 2021-03-02 MED ORDER — ACETAMINOPHEN 650 MG RE SUPP
650.0000 mg | RECTAL | Status: DC | PRN
  Filled 2021-03-02: qty 1

## 2021-03-02 MED ORDER — SODIUM CHLORIDE 0.9% FLUSH: 3.0000 mL | Freq: Two times a day (BID) | INTRAVENOUS | Status: DC

## 2021-03-02 MED ORDER — BACITRACIN 500 UNIT/GM EX OINT
TOPICAL_OINTMENT | CUTANEOUS | Status: DC | PRN
  Administered 2021-03-02: 1 via TOPICAL

## 2021-03-02 MED ORDER — BACITRACIN ZINC 500 UNIT/GM EX OINT
TOPICAL_OINTMENT | CUTANEOUS | Status: AC
  Filled 2021-03-02: qty 28.35

## 2021-03-02 MED ORDER — FENTANYL CITRATE (PF) 100 MCG/2ML IJ SOLN
INTRAMUSCULAR | Status: DC | PRN
  Administered 2021-03-02: 50 ug via INTRAVENOUS
  Administered 2021-03-02 (×2): 25 ug via INTRAVENOUS

## 2021-03-02 MED ORDER — OXYCODONE HCL 5 MG PO TABS: 5.0000 mg | ORAL_TABLET | ORAL | Status: DC | PRN

## 2021-03-02 MED ORDER — LIDOCAINE HCL 2 % IJ SOLN
INTRAMUSCULAR | Status: DC | PRN
  Administered 2021-03-02: 2.5 mL

## 2021-03-02 MED ORDER — OXYCODONE HCL 5 MG/5ML PO SOLN: 5.0000 mg | Freq: Once | ORAL | Status: DC | PRN

## 2021-03-02 MED ORDER — PROPOFOL 10 MG/ML IV BOLUS
INTRAVENOUS | Status: DC | PRN
  Administered 2021-03-02 (×3): 20 mg via INTRAVENOUS
  Administered 2021-03-02: 30 mg via INTRAVENOUS
  Administered 2021-03-02: 20 mg via INTRAVENOUS

## 2021-03-02 MED ORDER — HYDROCODONE-ACETAMINOPHEN 5-325 MG PO TABS: 1.0000 | ORAL_TABLET | Freq: Four times a day (QID) | ORAL | 0 refills | Status: AC | PRN

## 2021-03-02 MED ORDER — ACETAMINOPHEN 500 MG PO TABS: 1000.0000 mg | ORAL_TABLET | Freq: Once | ORAL | Status: DC | PRN

## 2021-03-02 MED ORDER — LACTATED RINGERS IV SOLN: INTRAVENOUS | Status: DC

## 2021-03-02 MED ORDER — CEFAZOLIN IN SODIUM CHLORIDE 3-0.9 GM/100ML-% IV SOLN
3.0000 g | INTRAVENOUS | Status: AC
  Administered 2021-03-02: 3 g via INTRAVENOUS
  Filled 2021-03-02: qty 100

## 2021-03-02 MED ORDER — BUPIVACAINE HCL 0.25 % IJ SOLN
INTRAMUSCULAR | Status: AC
  Filled 2021-03-02: qty 1

## 2021-03-02 MED ORDER — SODIUM CHLORIDE 0.9% FLUSH: 3.0000 mL | INTRAVENOUS | Status: DC | PRN

## 2021-03-02 MED ORDER — ACETAMINOPHEN 160 MG/5ML PO SOLN: 1000.0000 mg | Freq: Once | ORAL | Status: DC | PRN

## 2021-03-02 MED ORDER — LIDOCAINE HCL 2 % IJ SOLN
INTRAMUSCULAR | Status: AC
  Filled 2021-03-02: qty 20

## 2021-03-02 MED ORDER — CHLORHEXIDINE GLUCONATE 0.12 % MT SOLN
15.0000 mL | Freq: Once | OROMUCOSAL | Status: AC
  Administered 2021-03-02: 15 mL via OROMUCOSAL

## 2021-03-02 MED ORDER — DEXAMETHASONE SODIUM PHOSPHATE 10 MG/ML IJ SOLN
INTRAMUSCULAR | Status: AC
  Filled 2021-03-02: qty 1

## 2021-03-02 MED ORDER — ONDANSETRON HCL 4 MG/2ML IJ SOLN
INTRAMUSCULAR | Status: AC
  Filled 2021-03-02: qty 2

## 2021-03-02 SURGICAL SUPPLY — 28 items
BENZOIN TINCTURE PRP APPL 2/3 (GAUZE/BANDAGES/DRESSINGS) IMPLANT
BNDG COHESIVE 1X5 TAN STRL LF (GAUZE/BANDAGES/DRESSINGS) ×3 IMPLANT
BNDG GAUZE ELAST 4 BULKY (GAUZE/BANDAGES/DRESSINGS) IMPLANT
COVER SURGICAL LIGHT HANDLE (MISCELLANEOUS) ×3 IMPLANT
DRAPE LAPAROTOMY T 98X78 PEDS (DRAPES) ×3 IMPLANT
ELECT REM PT RETURN 15FT ADLT (MISCELLANEOUS) ×3 IMPLANT
GAUZE 4X4 16PLY RFD (DISPOSABLE) ×6 IMPLANT
GAUZE SPONGE 4X4 12PLY STRL (GAUZE/BANDAGES/DRESSINGS) ×3 IMPLANT
GLOVE SURG POLYISO LF SZ8 (GLOVE) ×3 IMPLANT
GOWN STRL REUS W/TWL XL LVL3 (GOWN DISPOSABLE) ×3 IMPLANT
IV NS 1000ML (IV SOLUTION) ×3
IV NS 1000ML BAXH (IV SOLUTION) ×1 IMPLANT
KIT BASIN OR (CUSTOM PROCEDURE TRAY) ×3 IMPLANT
KIT TURNOVER KIT A (KITS) ×3 IMPLANT
NEEDLE HYPO 22GX1.5 SAFETY (NEEDLE) ×3 IMPLANT
NS IRRIG 1000ML POUR BTL (IV SOLUTION) IMPLANT
PACK GENERAL/GYN (CUSTOM PROCEDURE TRAY) ×3 IMPLANT
RETRACTOR WILSON SYSTEM (INSTRUMENTS) ×3 IMPLANT
SOL PREP PROV IODINE SCRUB 4OZ (MISCELLANEOUS) ×3 IMPLANT
SUT CHROMIC 3 0 SH 27 (SUTURE) ×6 IMPLANT
SUT CHROMIC 4 0 SH 27 (SUTURE) ×3 IMPLANT
SUT VIC AB 3-0 SH 27 (SUTURE) ×3
SUT VIC AB 3-0 SH 27XBRD (SUTURE) ×1 IMPLANT
SUT VICRYL 0 TIES 12 18 (SUTURE) IMPLANT
SYR 10ML LL (SYRINGE) ×3 IMPLANT
SYR CONTROL 10ML LL (SYRINGE) ×3 IMPLANT
TOWEL OR 17X26 10 PK STRL BLUE (TOWEL DISPOSABLE) ×3 IMPLANT
TOWEL OR NON WOVEN STRL DISP B (DISPOSABLE) ×3 IMPLANT

## 2021-03-02 NOTE — Discharge Instructions (Signed)
Penile Biopsy-Home Care Instructions  The following instructions have been prepared to help you care for yourself upon your return home today.   Wound Care & Hygiene:   You may apply ice to the penis.  This may help to decrease swelling.  Remove the dressing tomorrow.  If the dressing falls off before then, leave it off.  You may shower or bathe in 48 hours  Gently wash the penis with soap and water.  The stitches do not need to be removed.  Activity:  Do not drive or operate any equipment today.  The effects of anesthesia are still present, drowsiness may result.  Rest today, not necessarily flat bed rest, just take it easy.  You may resume your normal activity in one to two days or as indicated by your physician.  Sexual Activity:  Erection and sexual relations should be avoided for *2 weeks.  Return to Work:  One to two days or as indicated by your physician .  Diet:  Drink liquids or eat a very light diet this evening.  You may resume a regular diet tomorrow.  General Expectations of your surgery:  You may have a small amount of bleeding The penis will be swollen and bruised for approximately one week You may wake during the night with an erection, usually this is caused by having a full bladder so you should try to urinate (pass your water) to relieve the erection or apply ice to the penis  Unexpected Observations - Call your doctor if these occur! Persistent or heavy bleeding Temperature of 101 degrees or more Severe pain not relieved by medication   Patient's Signature:__________________________________________________  Physician's Signature:___________________ Nurse's Signature_______________  [facility] [address], [place], [place] [address] Tel: [phone]

## 2021-03-02 NOTE — Op Note (Signed)
Procedure: Penile excisional biopsy of 1 x 3 cm lesion with 1 layer closure.  Preop diagnosis: Penile lesion.  Postop diagnosis: Same.  Surgeon: Dr. Bjorn Pippin.  Anesthesia: Local and sedation.  Specimen: Penile skin lesion.  EBL: 2 mL.  Drains: None.  Complications: None.  Indications: [name] is a 60 year old male who presented with an erythematous moist lesion of the right dorsal coronal sulcus with involvement of the glans.  It was felt that biopsy was indicated to rule out erythroplasia of carotid since he had failed prior topical therapy.  Procedure: He was taken operating room where he was left on the holding room stretcher.  He was given Ancef.  Sedation was given as indicated.  PAS hose were placed.  His genitalia was prepped with Betadine solution he was draped in usual the sterile fashion.  The lesion was infiltrated with approximately 3 to 4 mL of 50% mix of 2% lidocaine and 25% Marcaine.  The lesion in the coronal sulcus was then excised using a knife, Metzenbaum scissors and the Bovie.  I did not excise the glans portion of the laceration.  Hemostasis was achieved with the Bovie and the skin edges were reapproximated using a 4-0 chromic suture in a running fashion.  The wound was dressed with bacitracin ointment.  He was moved recovery room in stable condition.  There were no complications.

## 2021-03-02 NOTE — Transfer of Care (Signed)
Immediate Anesthesia Transfer of Care Note  Patient: [name]  Procedure(s) Performed: PENILE BIOPSY  Patient Location: PACU  Anesthesia Type:MAC  Level of Consciousness: awake, alert  and oriented  Airway & Oxygen Therapy: Patient Spontanous Breathing and Patient connected to face mask oxygen  Post-op Assessment: Report given to RN and Post -op Vital signs reviewed and stable  Post vital signs: Reviewed and stable  Last Vitals:  Vitals Value Taken Time  BP    Temp    Pulse 79 03/02/21 1210  Resp    SpO2 100 % 03/02/21 1210  Vitals shown include unvalidated device data.  Last Pain:  Vitals:   03/02/21 0858  TempSrc:   PainSc: 0-No pain         Complications: No notable events documented.

## 2021-03-02 NOTE — Interval H&P Note (Signed)
History and Physical Interval Note:  He has some chest congestion today.  We will do the procedure under local and sedation.   03/02/2021 11:06 AM  [name]  has presented today for surgery, with the diagnosis of PENILE LESION.  The various methods of treatment have been discussed with the patient and family. After consideration of risks, benefits and other options for treatment, the patient has consented to  Procedure(s): PENILE BIOPSY (N/A) as a surgical intervention.  The patient's history has been reviewed, patient examined, no change in status, stable for surgery.  I have reviewed the patient's chart and labs.  Questions were answered to the patient's satisfaction.     Bjorn Pippin

## 2021-03-03 ENCOUNTER — Encounter (HOSPITAL_COMMUNITY): Payer: Self-pay | Admitting: Urology

## 2021-03-06 LAB — SURGICAL PATHOLOGY

## 2021-03-07 NOTE — Anesthesia Postprocedure Evaluation (Signed)
Anesthesia Post Note  Patient: [name]  Procedure(s) Performed: PENILE BIOPSY     Patient location during evaluation: PACU Anesthesia Type: MAC Level of consciousness: awake and alert Pain management: pain level controlled Vital Signs Assessment: post-procedure vital signs reviewed and stable Respiratory status: spontaneous breathing, nonlabored ventilation, respiratory function stable and patient connected to nasal cannula oxygen Cardiovascular status: stable and blood pressure returned to baseline Postop Assessment: no apparent nausea or vomiting Anesthetic complications: no   No notable events documented.  Last Vitals:  Vitals:   03/02/21 1245 03/02/21 1300  BP: (!) 155/86 (!) 173/93  Pulse: 76 74  Resp: 17 15  Temp: 37 C 37 C  SpO2: 94% 95%    Last Pain:  Vitals:   03/02/21 1245  TempSrc:   PainSc: 0-No pain                 Lovetta Condie

## 2022-09-29 DIAGNOSIS — Z6841 Body Mass Index (BMI) 40.0 and over, adult: Secondary | ICD-10-CM | POA: Diagnosis not present

## 2022-09-29 DIAGNOSIS — Z449 Encounter for fitting and adjustment of unspecified external prosthetic device: Secondary | ICD-10-CM | POA: Diagnosis not present

## 2022-09-29 DIAGNOSIS — N521 Erectile dysfunction due to diseases classified elsewhere: Secondary | ICD-10-CM | POA: Diagnosis not present

## 2022-09-29 DIAGNOSIS — E669 Obesity, unspecified: Secondary | ICD-10-CM | POA: Diagnosis not present

## 2022-09-29 DIAGNOSIS — E1169 Type 2 diabetes mellitus with other specified complication: Secondary | ICD-10-CM | POA: Diagnosis not present

## 2022-12-08 DIAGNOSIS — E113293 Type 2 diabetes mellitus with mild nonproliferative diabetic retinopathy without macular edema, bilateral: Secondary | ICD-10-CM | POA: Diagnosis not present

## 2022-12-08 DIAGNOSIS — H524 Presbyopia: Secondary | ICD-10-CM | POA: Diagnosis not present

## 2022-12-08 DIAGNOSIS — H259 Unspecified age-related cataract: Secondary | ICD-10-CM | POA: Diagnosis not present

## 2023-01-02 DIAGNOSIS — H903 Sensorineural hearing loss, bilateral: Secondary | ICD-10-CM | POA: Diagnosis not present

## 2023-01-17 DIAGNOSIS — E119 Type 2 diabetes mellitus without complications: Secondary | ICD-10-CM | POA: Diagnosis not present

## 2023-01-17 DIAGNOSIS — H25813 Combined forms of age-related cataract, bilateral: Secondary | ICD-10-CM | POA: Diagnosis not present

## 2023-01-23 DIAGNOSIS — H903 Sensorineural hearing loss, bilateral: Secondary | ICD-10-CM | POA: Diagnosis not present

## 2023-03-01 DIAGNOSIS — K219 Gastro-esophageal reflux disease without esophagitis: Secondary | ICD-10-CM | POA: Diagnosis not present

## 2023-03-01 DIAGNOSIS — E1139 Type 2 diabetes mellitus with other diabetic ophthalmic complication: Secondary | ICD-10-CM | POA: Diagnosis not present

## 2023-03-01 DIAGNOSIS — R82998 Other abnormal findings in urine: Secondary | ICD-10-CM | POA: Diagnosis not present

## 2023-03-01 DIAGNOSIS — I480 Paroxysmal atrial fibrillation: Secondary | ICD-10-CM | POA: Diagnosis not present

## 2023-03-01 DIAGNOSIS — I129 Hypertensive chronic kidney disease with stage 1 through stage 4 chronic kidney disease, or unspecified chronic kidney disease: Secondary | ICD-10-CM | POA: Diagnosis not present

## 2023-03-01 DIAGNOSIS — N1831 Chronic kidney disease, stage 3a: Secondary | ICD-10-CM | POA: Diagnosis not present

## 2023-03-01 DIAGNOSIS — Z0000 Encounter for general adult medical examination without abnormal findings: Secondary | ICD-10-CM | POA: Diagnosis not present

## 2023-03-01 DIAGNOSIS — Z1331 Encounter for screening for depression: Secondary | ICD-10-CM | POA: Diagnosis not present

## 2023-03-01 DIAGNOSIS — R7989 Other specified abnormal findings of blood chemistry: Secondary | ICD-10-CM | POA: Diagnosis not present

## 2023-03-01 DIAGNOSIS — E785 Hyperlipidemia, unspecified: Secondary | ICD-10-CM | POA: Diagnosis not present

## 2023-03-01 DIAGNOSIS — I10 Essential (primary) hypertension: Secondary | ICD-10-CM | POA: Diagnosis not present

## 2023-03-01 DIAGNOSIS — E291 Testicular hypofunction: Secondary | ICD-10-CM | POA: Diagnosis not present

## 2023-03-01 DIAGNOSIS — Z794 Long term (current) use of insulin: Secondary | ICD-10-CM | POA: Diagnosis not present

## 2023-03-01 DIAGNOSIS — Z125 Encounter for screening for malignant neoplasm of prostate: Secondary | ICD-10-CM | POA: Diagnosis not present

## 2023-04-03 DIAGNOSIS — I5021 Acute systolic (congestive) heart failure: Secondary | ICD-10-CM | POA: Diagnosis not present

## 2023-04-03 DIAGNOSIS — R0609 Other forms of dyspnea: Secondary | ICD-10-CM | POA: Diagnosis not present

## 2023-04-03 DIAGNOSIS — I4510 Unspecified right bundle-branch block: Secondary | ICD-10-CM | POA: Diagnosis not present

## 2023-04-03 DIAGNOSIS — I480 Paroxysmal atrial fibrillation: Secondary | ICD-10-CM | POA: Diagnosis not present

## 2023-04-03 DIAGNOSIS — E119 Type 2 diabetes mellitus without complications: Secondary | ICD-10-CM | POA: Diagnosis not present

## 2023-04-03 DIAGNOSIS — R0789 Other chest pain: Secondary | ICD-10-CM | POA: Diagnosis not present

## 2023-04-05 DIAGNOSIS — I480 Paroxysmal atrial fibrillation: Secondary | ICD-10-CM | POA: Diagnosis not present

## 2023-04-20 DIAGNOSIS — C609 Malignant neoplasm of penis, unspecified: Secondary | ICD-10-CM | POA: Diagnosis not present

## 2023-04-20 DIAGNOSIS — N4889 Other specified disorders of penis: Secondary | ICD-10-CM | POA: Diagnosis not present

## 2023-04-24 DIAGNOSIS — I5021 Acute systolic (congestive) heart failure: Secondary | ICD-10-CM | POA: Diagnosis not present

## 2023-04-24 DIAGNOSIS — Z79899 Other long term (current) drug therapy: Secondary | ICD-10-CM | POA: Diagnosis not present

## 2023-04-24 DIAGNOSIS — Z7901 Long term (current) use of anticoagulants: Secondary | ICD-10-CM | POA: Diagnosis not present

## 2023-04-24 DIAGNOSIS — H25812 Combined forms of age-related cataract, left eye: Secondary | ICD-10-CM | POA: Diagnosis not present

## 2023-04-24 DIAGNOSIS — E1136 Type 2 diabetes mellitus with diabetic cataract: Secondary | ICD-10-CM | POA: Diagnosis not present

## 2023-04-24 DIAGNOSIS — I110 Hypertensive heart disease with heart failure: Secondary | ICD-10-CM | POA: Diagnosis not present

## 2023-04-24 DIAGNOSIS — T446X5A Adverse effect of alpha-adrenoreceptor antagonists, initial encounter: Secondary | ICD-10-CM | POA: Diagnosis not present

## 2023-04-24 DIAGNOSIS — H2181 Floppy iris syndrome: Secondary | ICD-10-CM | POA: Diagnosis not present

## 2023-04-24 DIAGNOSIS — H2512 Age-related nuclear cataract, left eye: Secondary | ICD-10-CM | POA: Diagnosis not present

## 2023-05-01 DIAGNOSIS — N4883 Acquired buried penis: Secondary | ICD-10-CM | POA: Diagnosis not present

## 2023-05-01 DIAGNOSIS — D074 Carcinoma in situ of penis: Secondary | ICD-10-CM | POA: Diagnosis not present

## 2023-05-01 DIAGNOSIS — Z9689 Presence of other specified functional implants: Secondary | ICD-10-CM | POA: Diagnosis not present

## 2023-05-08 DIAGNOSIS — I358 Other nonrheumatic aortic valve disorders: Secondary | ICD-10-CM | POA: Diagnosis not present

## 2023-05-23 DIAGNOSIS — H35352 Cystoid macular degeneration, left eye: Secondary | ICD-10-CM | POA: Diagnosis not present

## 2023-05-23 DIAGNOSIS — I480 Paroxysmal atrial fibrillation: Secondary | ICD-10-CM | POA: Diagnosis not present

## 2023-05-23 DIAGNOSIS — E113293 Type 2 diabetes mellitus with mild nonproliferative diabetic retinopathy without macular edema, bilateral: Secondary | ICD-10-CM | POA: Diagnosis not present

## 2023-05-23 DIAGNOSIS — H524 Presbyopia: Secondary | ICD-10-CM | POA: Diagnosis not present

## 2023-06-07 DIAGNOSIS — B372 Candidiasis of skin and nail: Secondary | ICD-10-CM | POA: Diagnosis not present

## 2023-06-07 DIAGNOSIS — T839XXA Unspecified complication of genitourinary prosthetic device, implant and graft, initial encounter: Secondary | ICD-10-CM | POA: Diagnosis not present

## 2023-06-07 DIAGNOSIS — E669 Obesity, unspecified: Secondary | ICD-10-CM | POA: Diagnosis not present

## 2023-06-07 DIAGNOSIS — Z6841 Body Mass Index (BMI) 40.0 and over, adult: Secondary | ICD-10-CM | POA: Diagnosis not present

## 2023-06-07 DIAGNOSIS — R739 Hyperglycemia, unspecified: Secondary | ICD-10-CM | POA: Diagnosis not present

## 2023-06-07 DIAGNOSIS — E1165 Type 2 diabetes mellitus with hyperglycemia: Secondary | ICD-10-CM | POA: Diagnosis not present

## 2023-06-07 DIAGNOSIS — D074 Carcinoma in situ of penis: Secondary | ICD-10-CM | POA: Diagnosis not present

## 2023-06-07 DIAGNOSIS — N475 Adhesions of prepuce and glans penis: Secondary | ICD-10-CM | POA: Diagnosis not present

## 2023-06-07 DIAGNOSIS — T83490A Other mechanical complication of penile (implanted) prosthesis, initial encounter: Secondary | ICD-10-CM | POA: Diagnosis not present

## 2023-06-07 DIAGNOSIS — N390 Urinary tract infection, site not specified: Secondary | ICD-10-CM | POA: Diagnosis not present

## 2023-06-07 DIAGNOSIS — N4889 Other specified disorders of penis: Secondary | ICD-10-CM | POA: Diagnosis not present

## 2023-06-08 DIAGNOSIS — H35352 Cystoid macular degeneration, left eye: Secondary | ICD-10-CM | POA: Diagnosis not present

## 2023-06-14 DIAGNOSIS — H59032 Cystoid macular edema following cataract surgery, left eye: Secondary | ICD-10-CM | POA: Diagnosis not present

## 2023-06-27 DIAGNOSIS — E291 Testicular hypofunction: Secondary | ICD-10-CM | POA: Diagnosis not present

## 2023-06-27 DIAGNOSIS — E1139 Type 2 diabetes mellitus with other diabetic ophthalmic complication: Secondary | ICD-10-CM | POA: Diagnosis not present

## 2023-06-27 DIAGNOSIS — L299 Pruritus, unspecified: Secondary | ICD-10-CM | POA: Diagnosis not present

## 2023-06-27 DIAGNOSIS — M2550 Pain in unspecified joint: Secondary | ICD-10-CM | POA: Diagnosis not present

## 2023-06-27 DIAGNOSIS — M545 Low back pain, unspecified: Secondary | ICD-10-CM | POA: Diagnosis not present

## 2023-06-27 DIAGNOSIS — I480 Paroxysmal atrial fibrillation: Secondary | ICD-10-CM | POA: Diagnosis not present

## 2023-07-08 DIAGNOSIS — I200 Unstable angina: Secondary | ICD-10-CM | POA: Diagnosis not present

## 2023-07-08 DIAGNOSIS — Z452 Encounter for adjustment and management of vascular access device: Secondary | ICD-10-CM | POA: Diagnosis not present

## 2023-07-08 DIAGNOSIS — R0609 Other forms of dyspnea: Secondary | ICD-10-CM | POA: Diagnosis not present

## 2023-07-08 DIAGNOSIS — I130 Hypertensive heart and chronic kidney disease with heart failure and stage 1 through stage 4 chronic kidney disease, or unspecified chronic kidney disease: Secondary | ICD-10-CM | POA: Diagnosis not present

## 2023-07-08 DIAGNOSIS — R931 Abnormal findings on diagnostic imaging of heart and coronary circulation: Secondary | ICD-10-CM | POA: Diagnosis not present

## 2023-07-08 DIAGNOSIS — E8770 Fluid overload, unspecified: Secondary | ICD-10-CM | POA: Diagnosis not present

## 2023-07-08 DIAGNOSIS — R600 Localized edema: Secondary | ICD-10-CM | POA: Diagnosis not present

## 2023-07-08 DIAGNOSIS — R140 Abdominal distension (gaseous): Secondary | ICD-10-CM | POA: Diagnosis not present

## 2023-07-08 DIAGNOSIS — D631 Anemia in chronic kidney disease: Secondary | ICD-10-CM | POA: Diagnosis not present

## 2023-07-08 DIAGNOSIS — I10 Essential (primary) hypertension: Secondary | ICD-10-CM | POA: Diagnosis not present

## 2023-07-08 DIAGNOSIS — I25118 Atherosclerotic heart disease of native coronary artery with other forms of angina pectoris: Secondary | ICD-10-CM | POA: Diagnosis not present

## 2023-07-08 DIAGNOSIS — E7800 Pure hypercholesterolemia, unspecified: Secondary | ICD-10-CM | POA: Diagnosis not present

## 2023-07-08 DIAGNOSIS — Z6841 Body Mass Index (BMI) 40.0 and over, adult: Secondary | ICD-10-CM | POA: Diagnosis not present

## 2023-07-08 DIAGNOSIS — Z794 Long term (current) use of insulin: Secondary | ICD-10-CM | POA: Diagnosis not present

## 2023-07-08 DIAGNOSIS — I5031 Acute diastolic (congestive) heart failure: Secondary | ICD-10-CM | POA: Diagnosis not present

## 2023-07-08 DIAGNOSIS — I480 Paroxysmal atrial fibrillation: Secondary | ICD-10-CM | POA: Diagnosis not present

## 2023-07-08 DIAGNOSIS — I110 Hypertensive heart disease with heart failure: Secondary | ICD-10-CM | POA: Diagnosis not present

## 2023-07-08 DIAGNOSIS — E785 Hyperlipidemia, unspecified: Secondary | ICD-10-CM | POA: Diagnosis not present

## 2023-07-08 DIAGNOSIS — I25110 Atherosclerotic heart disease of native coronary artery with unstable angina pectoris: Secondary | ICD-10-CM | POA: Diagnosis not present

## 2023-07-08 DIAGNOSIS — N1831 Chronic kidney disease, stage 3a: Secondary | ICD-10-CM | POA: Diagnosis not present

## 2023-07-08 DIAGNOSIS — E1122 Type 2 diabetes mellitus with diabetic chronic kidney disease: Secondary | ICD-10-CM | POA: Diagnosis not present

## 2023-07-08 DIAGNOSIS — E1169 Type 2 diabetes mellitus with other specified complication: Secondary | ICD-10-CM | POA: Diagnosis not present

## 2023-07-08 DIAGNOSIS — E119 Type 2 diabetes mellitus without complications: Secondary | ICD-10-CM | POA: Diagnosis not present

## 2023-07-08 DIAGNOSIS — R9439 Abnormal result of other cardiovascular function study: Secondary | ICD-10-CM | POA: Diagnosis not present

## 2023-07-08 DIAGNOSIS — I2510 Atherosclerotic heart disease of native coronary artery without angina pectoris: Secondary | ICD-10-CM | POA: Diagnosis not present

## 2023-07-08 DIAGNOSIS — R079 Chest pain, unspecified: Secondary | ICD-10-CM | POA: Diagnosis not present

## 2023-07-08 DIAGNOSIS — M7989 Other specified soft tissue disorders: Secondary | ICD-10-CM | POA: Diagnosis not present

## 2023-07-08 DIAGNOSIS — F329 Major depressive disorder, single episode, unspecified: Secondary | ICD-10-CM | POA: Diagnosis not present

## 2023-07-08 DIAGNOSIS — I5021 Acute systolic (congestive) heart failure: Secondary | ICD-10-CM | POA: Diagnosis not present

## 2023-07-08 DIAGNOSIS — I5030 Unspecified diastolic (congestive) heart failure: Secondary | ICD-10-CM | POA: Diagnosis not present

## 2023-07-08 DIAGNOSIS — E1140 Type 2 diabetes mellitus with diabetic neuropathy, unspecified: Secondary | ICD-10-CM | POA: Diagnosis not present

## 2023-07-08 DIAGNOSIS — J810 Acute pulmonary edema: Secondary | ICD-10-CM | POA: Diagnosis not present

## 2023-07-08 DIAGNOSIS — R0789 Other chest pain: Secondary | ICD-10-CM | POA: Diagnosis not present

## 2023-08-09 DIAGNOSIS — I5033 Acute on chronic diastolic (congestive) heart failure: Secondary | ICD-10-CM | POA: Diagnosis not present

## 2023-08-09 DIAGNOSIS — I5021 Acute systolic (congestive) heart failure: Secondary | ICD-10-CM | POA: Diagnosis not present

## 2023-08-21 DIAGNOSIS — H524 Presbyopia: Secondary | ICD-10-CM | POA: Diagnosis not present

## 2023-08-21 DIAGNOSIS — E1136 Type 2 diabetes mellitus with diabetic cataract: Secondary | ICD-10-CM | POA: Diagnosis not present

## 2023-08-21 DIAGNOSIS — Z7901 Long term (current) use of anticoagulants: Secondary | ICD-10-CM | POA: Diagnosis not present

## 2023-08-21 DIAGNOSIS — T446X5S Adverse effect of alpha-adrenoreceptor antagonists, sequela: Secondary | ICD-10-CM | POA: Diagnosis not present

## 2023-08-21 DIAGNOSIS — I110 Hypertensive heart disease with heart failure: Secondary | ICD-10-CM | POA: Diagnosis not present

## 2023-08-21 DIAGNOSIS — H25811 Combined forms of age-related cataract, right eye: Secondary | ICD-10-CM | POA: Diagnosis not present

## 2023-08-21 DIAGNOSIS — I5020 Unspecified systolic (congestive) heart failure: Secondary | ICD-10-CM | POA: Diagnosis not present

## 2023-08-21 DIAGNOSIS — H2511 Age-related nuclear cataract, right eye: Secondary | ICD-10-CM | POA: Diagnosis not present

## 2023-08-21 DIAGNOSIS — G4733 Obstructive sleep apnea (adult) (pediatric): Secondary | ICD-10-CM | POA: Diagnosis not present

## 2023-08-21 DIAGNOSIS — H2181 Floppy iris syndrome: Secondary | ICD-10-CM | POA: Diagnosis not present

## 2023-08-21 DIAGNOSIS — Z794 Long term (current) use of insulin: Secondary | ICD-10-CM | POA: Diagnosis not present

## 2023-08-21 DIAGNOSIS — I4892 Unspecified atrial flutter: Secondary | ICD-10-CM | POA: Diagnosis not present

## 2023-08-29 DIAGNOSIS — H35352 Cystoid macular degeneration, left eye: Secondary | ICD-10-CM | POA: Diagnosis not present

## 2023-08-29 DIAGNOSIS — E113293 Type 2 diabetes mellitus with mild nonproliferative diabetic retinopathy without macular edema, bilateral: Secondary | ICD-10-CM | POA: Diagnosis not present

## 2023-10-17 DIAGNOSIS — H26492 Other secondary cataract, left eye: Secondary | ICD-10-CM | POA: Diagnosis not present

## 2023-11-13 DIAGNOSIS — R079 Chest pain, unspecified: Secondary | ICD-10-CM | POA: Diagnosis not present

## 2023-11-13 DIAGNOSIS — I5033 Acute on chronic diastolic (congestive) heart failure: Secondary | ICD-10-CM | POA: Diagnosis not present

## 2023-11-13 DIAGNOSIS — I480 Paroxysmal atrial fibrillation: Secondary | ICD-10-CM | POA: Diagnosis not present

## 2023-11-20 DIAGNOSIS — E113393 Type 2 diabetes mellitus with moderate nonproliferative diabetic retinopathy without macular edema, bilateral: Secondary | ICD-10-CM | POA: Diagnosis not present

## 2023-11-20 DIAGNOSIS — H35372 Puckering of macula, left eye: Secondary | ICD-10-CM | POA: Diagnosis not present

## 2023-12-05 DIAGNOSIS — H35352 Cystoid macular degeneration, left eye: Secondary | ICD-10-CM | POA: Diagnosis not present

## 2023-12-05 DIAGNOSIS — G453 Amaurosis fugax: Secondary | ICD-10-CM | POA: Diagnosis not present

## 2023-12-07 DIAGNOSIS — M545 Low back pain, unspecified: Secondary | ICD-10-CM | POA: Diagnosis not present

## 2023-12-07 DIAGNOSIS — G8929 Other chronic pain: Secondary | ICD-10-CM | POA: Diagnosis not present

## 2023-12-07 DIAGNOSIS — M4312 Spondylolisthesis, cervical region: Secondary | ICD-10-CM | POA: Diagnosis not present

## 2023-12-07 DIAGNOSIS — M47812 Spondylosis without myelopathy or radiculopathy, cervical region: Secondary | ICD-10-CM | POA: Diagnosis not present

## 2023-12-07 DIAGNOSIS — M542 Cervicalgia: Secondary | ICD-10-CM | POA: Diagnosis not present

## 2024-05-13 ENCOUNTER — Other Ambulatory Visit (INDEPENDENT_AMBULATORY_CARE_PROVIDER_SITE_OTHER)

## 2024-05-13 ENCOUNTER — Encounter: Payer: Self-pay | Admitting: Orthopaedic Surgery

## 2024-05-13 ENCOUNTER — Ambulatory Visit (INDEPENDENT_AMBULATORY_CARE_PROVIDER_SITE_OTHER): Admitting: Orthopaedic Surgery

## 2024-05-13 VITALS — Ht 72.0 in | Wt 322.0 lb

## 2024-05-13 DIAGNOSIS — M25561 Pain in right knee: Secondary | ICD-10-CM

## 2024-05-13 DIAGNOSIS — M25562 Pain in left knee: Secondary | ICD-10-CM | POA: Diagnosis not present

## 2024-05-13 DIAGNOSIS — G8929 Other chronic pain: Secondary | ICD-10-CM | POA: Diagnosis not present

## 2024-05-13 DIAGNOSIS — M25552 Pain in left hip: Secondary | ICD-10-CM

## 2024-05-13 MED ORDER — METHYLPREDNISOLONE ACETATE 40 MG/ML IJ SUSP
40.0000 mg | INTRAMUSCULAR | Status: AC | PRN
  Administered 2024-05-13: 40 mg via INTRA_ARTICULAR

## 2024-05-13 MED ORDER — LIDOCAINE HCL 1 % IJ SOLN
3.0000 mL | INTRAMUSCULAR | Status: AC | PRN
  Administered 2024-05-13: 3 mL

## 2024-05-13 NOTE — Progress Notes (Signed)
 The patient is a 63 year old gentleman that we have been seeing for many years now.  Is been a while since we seen him.  He comes in today with bilateral knee pain and left hip pain.  He points to the trochanteric area of the left hip as source of his pain he denies any groin pain.  He has had some pain in the back of his thigh and apparently has had some back issues in the past.  He does have pain in both of his knees.  He has a remote history of a left knee arthroscopy done many years ago that we had done.  He says since we have seen him last he had had significant heart issues and has 2 stents.  He is on blood thinning medication as well.  He is also on insulin  and his blood glucose has been running high with a hemoglobin A1c of over 8.  He has been trying to work on that.  I was able to review his medications and past medical history within epic.  On exam his left and right hips move smoothly and fluidly with only pain of the trochanteric area and some in the thigh along the hamstring area.  Some of this may be back related.  He is morbidly obese with a BMI of 43.67 but he has lost some weight.  Examination of both knees shows slight varus malalignment but no effusion and mainly medial joint line tenderness but also patellofemoral crepitation.  He says his biggest problem is just getting up from a seated position overall.  An AP pelvis and lateral left hip shows normal-appearing hip joint spaces bilaterally and no irregularities of the femoral head with either hip.  X-rays of his knees shows significant patellofemoral arthritis of both knees.  The right knee has more varus malalignment and osteophytes in all 3 compartments as well as almost bone-on-bone wear of the medial compartment.  We talked about a steroid injection in just 1 knee today given his poorly controlled blood glucose.  He is a good candidate for hyaluronic acid for both knees and we will see if we get this approved for both knees since he  is not a surgical candidate currently.  He did tolerate steroid injection in his right knee well today.  He knows to watch his blood glucose closely.  Will hopefully see him in follow-up for hyaluronic acid for both knees to treat the pain from osteoarthritis.  This patient is diagnosed with osteoarthritis of the knee(s).    Radiographs show evidence of joint space narrowing, osteophytes, subchondral sclerosis and/or subchondral cysts.  This patient has knee pain which interferes with functional and activities of daily living.    This patient has experienced inadequate response, adverse effects and/or intolerance with conservative treatments such as acetaminophen , NSAIDS, topical creams, physical therapy or regular exercise, knee bracing and/or weight loss.   This patient has experienced inadequate response or has a contraindication to intra articular steroid injections for at least 3 months.   This patient is not scheduled to have a total knee replacement within 6 months of starting treatment with viscosupplementation.     Procedure Note  Patient: [name]             Date of Birth: 05-Feb-1961           MRN: [id number]             Visit Date: 05/13/2024  Procedures: Visit Diagnoses:  1. Pain of left  hip   2. Chronic pain of left knee   3. Chronic pain of right knee     Large Joint Inj: R knee on 05/13/2024 2:19 PM Indications: diagnostic evaluation and pain Details: 22 G 1.5 in needle, superolateral approach  Arthrogram: No  Medications: 3 mL lidocaine  1 %; 40 mg methylPREDNISolone  acetate 40 MG/ML Outcome: tolerated well, no immediate complications Procedure, treatment alternatives, risks and benefits explained, specific risks discussed. Consent was given by the patient. Immediately prior to procedure a time out was called to verify the correct patient, procedure, equipment, support staff and site/side marked as required. Patient was prepped and draped in the usual sterile  fashion.

## 2024-05-14 ENCOUNTER — Other Ambulatory Visit: Payer: Self-pay

## 2024-05-14 DIAGNOSIS — G8929 Other chronic pain: Secondary | ICD-10-CM

## 2024-05-22 ENCOUNTER — Other Ambulatory Visit: Payer: Self-pay

## 2024-06-17 ENCOUNTER — Encounter: Payer: Self-pay | Admitting: Orthopaedic Surgery

## 2024-06-17 ENCOUNTER — Ambulatory Visit: Admitting: Orthopaedic Surgery

## 2024-06-17 DIAGNOSIS — M1712 Unilateral primary osteoarthritis, left knee: Secondary | ICD-10-CM

## 2024-06-17 DIAGNOSIS — M25562 Pain in left knee: Secondary | ICD-10-CM | POA: Diagnosis not present

## 2024-06-17 DIAGNOSIS — G8929 Other chronic pain: Secondary | ICD-10-CM

## 2024-06-17 DIAGNOSIS — M1711 Unilateral primary osteoarthritis, right knee: Secondary | ICD-10-CM

## 2024-06-17 DIAGNOSIS — M170 Bilateral primary osteoarthritis of knee: Secondary | ICD-10-CM

## 2024-06-17 DIAGNOSIS — M25561 Pain in right knee: Secondary | ICD-10-CM

## 2024-06-17 MED ORDER — SODIUM HYALURONATE 60 MG/3ML IX PRSY
60.0000 mg | PREFILLED_SYRINGE | INTRA_ARTICULAR | Status: AC | PRN
  Administered 2024-06-17: 60 mg via INTRA_ARTICULAR

## 2024-06-17 NOTE — Progress Notes (Signed)
   Procedure Note  Patient: [name]             Date of Birth: 25-Jul-1961           MRN: [id number]             Visit Date: 06/17/2024  Procedures: Visit Diagnoses:  1. Chronic pain of left knee   2. Chronic pain of right knee   3. Unilateral primary osteoarthritis, right knee   4. Unilateral primary osteoarthritis, left knee     Large Joint Inj: R knee on 06/17/2024 2:22 PM Indications: diagnostic evaluation and pain Details: 22 G 1.5 in needle, superolateral approach  Arthrogram: No  Medications: 60 mg Sodium Hyaluronate 60 MG/3ML Outcome: tolerated well, no immediate complications Procedure, treatment alternatives, risks and benefits explained, specific risks discussed. Consent was given by the patient. Immediately prior to procedure a time out was called to verify the correct patient, procedure, equipment, support staff and site/side marked as required. Patient was prepped and draped in the usual sterile fashion.    Large Joint Inj: L knee on 06/17/2024 2:22 PM Indications: diagnostic evaluation and pain Details: 22 G 1.5 in needle, superolateral approach  Arthrogram: No  Medications: 60 mg Sodium Hyaluronate 60 MG/3ML Outcome: tolerated well, no immediate complications Procedure, treatment alternatives, risks and benefits explained, specific risks discussed. Consent was given by the patient. Immediately prior to procedure a time out was called to verify the correct patient, procedure, equipment, support staff and site/side marked as required. Patient was prepped and draped in the usual sterile fashion.    The patient is here today for bilateral knee injections with hyaluronic acid using Durolane to treat the pain from osteoarthritis.  He has had no acute changes in medical status and has failed other conservative treatment measures including steroid injections.  On exam today neither knee has an effusion.  He has slight varus malalignment of his knees and pain  throughout the arc of motion of both knees.  Both knees are ligamentously stable.  Durolane was placed in both knees today without difficulty.  He knows to wait least 6 months between these injections and only if needed.   Lot #[id number], [phone]
